# Patient Record
Sex: Male | Born: 1935 | Race: Black or African American | Hispanic: No
Health system: Southern US, Community
[De-identification: ages and names within clinical notes are randomized; demographics above are authoritative.]

## PROBLEM LIST (undated history)

## (undated) DIAGNOSIS — F329 Major depressive disorder, single episode, unspecified: Secondary | ICD-10-CM

## (undated) DIAGNOSIS — I10 Essential (primary) hypertension: Secondary | ICD-10-CM

## (undated) DIAGNOSIS — F32A Depression, unspecified: Secondary | ICD-10-CM

## (undated) DIAGNOSIS — E785 Hyperlipidemia, unspecified: Secondary | ICD-10-CM

## (undated) HISTORY — PX: EYE SURGERY: SHX253

---

## 2014-11-14 ENCOUNTER — Inpatient Hospital Stay (HOSPITAL_COMMUNITY): Payer: Medicare Other

## 2014-11-14 DIAGNOSIS — R001 Bradycardia, unspecified: Secondary | ICD-10-CM | POA: Diagnosis present

## 2014-11-14 DIAGNOSIS — Z66 Do not resuscitate: Secondary | ICD-10-CM | POA: Diagnosis present

## 2014-11-14 DIAGNOSIS — J9601 Acute respiratory failure with hypoxia: Secondary | ICD-10-CM | POA: Diagnosis not present

## 2014-11-14 DIAGNOSIS — D696 Thrombocytopenia, unspecified: Secondary | ICD-10-CM | POA: Diagnosis present

## 2014-11-14 DIAGNOSIS — Z79899 Other long term (current) drug therapy: Secondary | ICD-10-CM

## 2014-11-14 DIAGNOSIS — D649 Anemia, unspecified: Secondary | ICD-10-CM | POA: Diagnosis present

## 2014-11-14 DIAGNOSIS — E43 Unspecified severe protein-calorie malnutrition: Secondary | ICD-10-CM | POA: Diagnosis present

## 2014-11-14 DIAGNOSIS — I480 Paroxysmal atrial fibrillation: Secondary | ICD-10-CM | POA: Diagnosis present

## 2014-11-14 DIAGNOSIS — I10 Essential (primary) hypertension: Secondary | ICD-10-CM | POA: Diagnosis present

## 2014-11-14 DIAGNOSIS — F329 Major depressive disorder, single episode, unspecified: Secondary | ICD-10-CM | POA: Diagnosis present

## 2014-11-14 DIAGNOSIS — R4020 Unspecified coma: Secondary | ICD-10-CM | POA: Diagnosis not present

## 2014-11-14 DIAGNOSIS — Y92009 Unspecified place in unspecified non-institutional (private) residence as the place of occurrence of the external cause: Secondary | ICD-10-CM | POA: Diagnosis not present

## 2014-11-14 DIAGNOSIS — I63419 Cerebral infarction due to embolism of unspecified middle cerebral artery: Secondary | ICD-10-CM | POA: Diagnosis not present

## 2014-11-14 DIAGNOSIS — S72001A Fracture of unspecified part of neck of right femur, initial encounter for closed fracture: Secondary | ICD-10-CM | POA: Diagnosis present

## 2014-11-14 DIAGNOSIS — F0390 Unspecified dementia without behavioral disturbance: Secondary | ICD-10-CM | POA: Diagnosis present

## 2014-11-14 DIAGNOSIS — M25551 Pain in right hip: Secondary | ICD-10-CM | POA: Diagnosis present

## 2014-11-14 DIAGNOSIS — G8929 Other chronic pain: Secondary | ICD-10-CM | POA: Diagnosis present

## 2014-11-14 DIAGNOSIS — E785 Hyperlipidemia, unspecified: Secondary | ICD-10-CM | POA: Diagnosis present

## 2014-11-14 DIAGNOSIS — D72829 Elevated white blood cell count, unspecified: Secondary | ICD-10-CM | POA: Diagnosis present

## 2014-11-14 DIAGNOSIS — G253 Myoclonus: Secondary | ICD-10-CM | POA: Diagnosis present

## 2014-11-14 DIAGNOSIS — N179 Acute kidney failure, unspecified: Secondary | ICD-10-CM | POA: Diagnosis not present

## 2014-11-14 DIAGNOSIS — R569 Unspecified convulsions: Secondary | ICD-10-CM | POA: Diagnosis present

## 2014-11-14 DIAGNOSIS — Z7982 Long term (current) use of aspirin: Secondary | ICD-10-CM | POA: Diagnosis not present

## 2014-11-14 DIAGNOSIS — Z515 Encounter for palliative care: Secondary | ICD-10-CM

## 2014-11-14 DIAGNOSIS — W010XXA Fall on same level from slipping, tripping and stumbling without subsequent striking against object, initial encounter: Secondary | ICD-10-CM | POA: Diagnosis present

## 2014-11-15 ENCOUNTER — Encounter (HOSPITAL_COMMUNITY): Payer: Self-pay | Admitting: Internal Medicine

## 2014-11-15 ENCOUNTER — Inpatient Hospital Stay (HOSPITAL_COMMUNITY): Payer: Medicare Other | Admitting: Certified Registered Nurse Anesthetist

## 2014-11-15 ENCOUNTER — Other Ambulatory Visit: Payer: Self-pay | Admitting: Orthopedic Surgery

## 2014-11-15 ENCOUNTER — Inpatient Hospital Stay (HOSPITAL_COMMUNITY)
Admission: EM | Admit: 2014-11-15 | Discharge: 2014-11-28 | DRG: 469 | Disposition: E | Payer: Medicare Other | Source: Other Acute Inpatient Hospital | Attending: Internal Medicine | Admitting: Internal Medicine

## 2014-11-15 ENCOUNTER — Inpatient Hospital Stay (HOSPITAL_COMMUNITY): Payer: Medicare Other

## 2014-11-15 ENCOUNTER — Encounter (HOSPITAL_COMMUNITY): Admission: EM | Disposition: E | Payer: Self-pay | Source: Other Acute Inpatient Hospital | Attending: Pulmonary Disease

## 2014-11-15 DIAGNOSIS — E785 Hyperlipidemia, unspecified: Secondary | ICD-10-CM | POA: Diagnosis present

## 2014-11-15 DIAGNOSIS — I10 Essential (primary) hypertension: Secondary | ICD-10-CM | POA: Diagnosis present

## 2014-11-15 DIAGNOSIS — N179 Acute kidney failure, unspecified: Secondary | ICD-10-CM | POA: Insufficient documentation

## 2014-11-15 DIAGNOSIS — R569 Unspecified convulsions: Secondary | ICD-10-CM | POA: Diagnosis not present

## 2014-11-15 DIAGNOSIS — D649 Anemia, unspecified: Secondary | ICD-10-CM | POA: Diagnosis present

## 2014-11-15 DIAGNOSIS — I6340 Cerebral infarction due to embolism of unspecified cerebral artery: Secondary | ICD-10-CM | POA: Diagnosis not present

## 2014-11-15 DIAGNOSIS — Z09 Encounter for follow-up examination after completed treatment for conditions other than malignant neoplasm: Secondary | ICD-10-CM

## 2014-11-15 DIAGNOSIS — S72009A Fracture of unspecified part of neck of unspecified femur, initial encounter for closed fracture: Secondary | ICD-10-CM | POA: Insufficient documentation

## 2014-11-15 DIAGNOSIS — Z978 Presence of other specified devices: Secondary | ICD-10-CM

## 2014-11-15 DIAGNOSIS — Z7982 Long term (current) use of aspirin: Secondary | ICD-10-CM | POA: Diagnosis not present

## 2014-11-15 DIAGNOSIS — G8929 Other chronic pain: Secondary | ICD-10-CM | POA: Diagnosis present

## 2014-11-15 DIAGNOSIS — D696 Thrombocytopenia, unspecified: Secondary | ICD-10-CM | POA: Diagnosis present

## 2014-11-15 DIAGNOSIS — D72829 Elevated white blood cell count, unspecified: Secondary | ICD-10-CM | POA: Diagnosis present

## 2014-11-15 DIAGNOSIS — S72001A Fracture of unspecified part of neck of right femur, initial encounter for closed fracture: Secondary | ICD-10-CM | POA: Diagnosis present

## 2014-11-15 DIAGNOSIS — I480 Paroxysmal atrial fibrillation: Secondary | ICD-10-CM | POA: Diagnosis present

## 2014-11-15 DIAGNOSIS — F329 Major depressive disorder, single episode, unspecified: Secondary | ICD-10-CM | POA: Diagnosis present

## 2014-11-15 DIAGNOSIS — M25551 Pain in right hip: Secondary | ICD-10-CM | POA: Diagnosis present

## 2014-11-15 DIAGNOSIS — I639 Cerebral infarction, unspecified: Secondary | ICD-10-CM

## 2014-11-15 DIAGNOSIS — G9340 Encephalopathy, unspecified: Secondary | ICD-10-CM

## 2014-11-15 DIAGNOSIS — J9601 Acute respiratory failure with hypoxia: Secondary | ICD-10-CM | POA: Diagnosis not present

## 2014-11-15 DIAGNOSIS — Y92009 Unspecified place in unspecified non-institutional (private) residence as the place of occurrence of the external cause: Secondary | ICD-10-CM | POA: Diagnosis not present

## 2014-11-15 DIAGNOSIS — E43 Unspecified severe protein-calorie malnutrition: Secondary | ICD-10-CM | POA: Diagnosis present

## 2014-11-15 DIAGNOSIS — Z66 Do not resuscitate: Secondary | ICD-10-CM | POA: Diagnosis present

## 2014-11-15 DIAGNOSIS — Z515 Encounter for palliative care: Secondary | ICD-10-CM | POA: Diagnosis not present

## 2014-11-15 DIAGNOSIS — R001 Bradycardia, unspecified: Secondary | ICD-10-CM | POA: Diagnosis present

## 2014-11-15 DIAGNOSIS — R4020 Unspecified coma: Secondary | ICD-10-CM | POA: Diagnosis not present

## 2014-11-15 DIAGNOSIS — Z9289 Personal history of other medical treatment: Secondary | ICD-10-CM

## 2014-11-15 DIAGNOSIS — W010XXA Fall on same level from slipping, tripping and stumbling without subsequent striking against object, initial encounter: Secondary | ICD-10-CM | POA: Diagnosis present

## 2014-11-15 DIAGNOSIS — W19XXXA Unspecified fall, initial encounter: Secondary | ICD-10-CM | POA: Diagnosis not present

## 2014-11-15 DIAGNOSIS — Z79899 Other long term (current) drug therapy: Secondary | ICD-10-CM | POA: Diagnosis not present

## 2014-11-15 DIAGNOSIS — F0390 Unspecified dementia without behavioral disturbance: Secondary | ICD-10-CM | POA: Diagnosis present

## 2014-11-15 DIAGNOSIS — G253 Myoclonus: Secondary | ICD-10-CM | POA: Diagnosis present

## 2014-11-15 DIAGNOSIS — I63419 Cerebral infarction due to embolism of unspecified middle cerebral artery: Secondary | ICD-10-CM | POA: Diagnosis not present

## 2014-11-15 HISTORY — DX: Major depressive disorder, single episode, unspecified: F32.9

## 2014-11-15 HISTORY — DX: Essential (primary) hypertension: I10

## 2014-11-15 HISTORY — PX: TOTAL HIP ARTHROPLASTY: SHX124

## 2014-11-15 HISTORY — DX: Hyperlipidemia, unspecified: E78.5

## 2014-11-15 HISTORY — DX: Depression, unspecified: F32.A

## 2014-11-15 LAB — ABO/RH: ABO/RH(D): A POS

## 2014-11-15 LAB — RETICULOCYTES
RBC.: 4.23 MIL/uL (ref 4.22–5.81)
RETIC COUNT ABSOLUTE: 29.6 10*3/uL (ref 19.0–186.0)
Retic Ct Pct: 0.7 % (ref 0.4–3.1)

## 2014-11-15 LAB — CBC WITH DIFFERENTIAL/PLATELET
Basophils Absolute: 0 10*3/uL (ref 0.0–0.1)
Basophils Relative: 0 % (ref 0–1)
Eosinophils Absolute: 0.1 10*3/uL (ref 0.0–0.7)
Eosinophils Relative: 1 % (ref 0–5)
HEMATOCRIT: 37.3 % — AB (ref 39.0–52.0)
Hemoglobin: 12.3 g/dL — ABNORMAL LOW (ref 13.0–17.0)
LYMPHS PCT: 16 % (ref 12–46)
Lymphs Abs: 1.1 10*3/uL (ref 0.7–4.0)
MCH: 29.1 pg (ref 26.0–34.0)
MCHC: 33 g/dL (ref 30.0–36.0)
MCV: 88.2 fL (ref 78.0–100.0)
Monocytes Absolute: 0.7 10*3/uL (ref 0.1–1.0)
Monocytes Relative: 10 % (ref 3–12)
NEUTROS ABS: 5 10*3/uL (ref 1.7–7.7)
NEUTROS PCT: 73 % (ref 43–77)
Platelets: 136 10*3/uL — ABNORMAL LOW (ref 150–400)
RBC: 4.23 MIL/uL (ref 4.22–5.81)
RDW: 14.5 % (ref 11.5–15.5)
WBC: 6.8 10*3/uL (ref 4.0–10.5)

## 2014-11-15 LAB — POCT I-STAT, CHEM 8
BUN: 28 mg/dL — AB (ref 6–20)
CALCIUM ION: 1.2 mmol/L (ref 1.13–1.30)
CHLORIDE: 103 mmol/L (ref 101–111)
Creatinine, Ser: 1.2 mg/dL (ref 0.61–1.24)
GLUCOSE: 86 mg/dL (ref 65–99)
HEMATOCRIT: 42 % (ref 39.0–52.0)
Hemoglobin: 14.3 g/dL (ref 13.0–17.0)
POTASSIUM: 5.8 mmol/L — AB (ref 3.5–5.1)
Sodium: 139 mmol/L (ref 135–145)
TCO2: 27 mmol/L (ref 0–100)

## 2014-11-15 LAB — SURGICAL PCR SCREEN
MRSA, PCR: NEGATIVE
Staphylococcus aureus: POSITIVE — AB

## 2014-11-15 LAB — IRON AND TIBC
Iron: 30 ug/dL — ABNORMAL LOW (ref 45–182)
Saturation Ratios: 12 % — ABNORMAL LOW (ref 17.9–39.5)
TIBC: 251 ug/dL (ref 250–450)
UIBC: 221 ug/dL

## 2014-11-15 LAB — TYPE AND SCREEN
ABO/RH(D): A POS
Antibody Screen: NEGATIVE

## 2014-11-15 LAB — PROTIME-INR
INR: 1.26 (ref 0.00–1.49)
Prothrombin Time: 15.9 seconds — ABNORMAL HIGH (ref 11.6–15.2)

## 2014-11-15 LAB — FERRITIN: Ferritin: 151 ng/mL (ref 24–336)

## 2014-11-15 LAB — FOLATE: Folate: 13.4 ng/mL (ref >5.9)

## 2014-11-15 LAB — APTT: aPTT: 32 seconds (ref 24–37)

## 2014-11-15 LAB — VITAMIN B12: Vitamin B-12: 158 pg/mL — ABNORMAL LOW (ref 180–914)

## 2014-11-15 SURGERY — ARTHROPLASTY, HIP, TOTAL, ANTERIOR APPROACH
Anesthesia: Monitor Anesthesia Care | Site: Hip | Laterality: Right

## 2014-11-15 MED ORDER — DEXAMETHASONE SODIUM PHOSPHATE 10 MG/ML IJ SOLN
INTRAMUSCULAR | Status: DC | PRN
  Administered 2014-11-15: 10 mg via INTRAVENOUS

## 2014-11-15 MED ORDER — BUPIVACAINE-EPINEPHRINE (PF) 0.5% -1:200000 IJ SOLN
INTRAMUSCULAR | Status: AC
  Filled 2014-11-15: qty 30

## 2014-11-15 MED ORDER — ACETAMINOPHEN 650 MG RE SUPP: 650.0000 mg | Freq: Four times a day (QID) | RECTAL | Status: DC | PRN

## 2014-11-15 MED ORDER — FENTANYL CITRATE (PF) 100 MCG/2ML IJ SOLN
INTRAMUSCULAR | Status: DC | PRN
  Administered 2014-11-15 (×5): 50 ug via INTRAVENOUS

## 2014-11-15 MED ORDER — ASPIRIN EC 325 MG PO TBEC: 325.0000 mg | DELAYED_RELEASE_TABLET | Freq: Two times a day (BID) | ORAL | Status: DC

## 2014-11-15 MED ORDER — HYDROCODONE-ACETAMINOPHEN 5-325 MG PO TABS: 1.0000 | ORAL_TABLET | Freq: Four times a day (QID) | ORAL | Status: DC | PRN

## 2014-11-15 MED ORDER — CEFAZOLIN SODIUM-DEXTROSE 2-3 GM-% IV SOLR
2.0000 g | INTRAVENOUS | Status: AC
  Administered 2014-11-15 – 2014-11-16 (×2): 2 g via INTRAVENOUS
  Filled 2014-11-15: qty 50

## 2014-11-15 MED ORDER — KETOROLAC TROMETHAMINE 30 MG/ML IJ SOLN
INTRAMUSCULAR | Status: AC
  Filled 2014-11-15: qty 1

## 2014-11-15 MED ORDER — HYDROGEN PEROXIDE 3 % EX SOLN
CUTANEOUS | Status: DC | PRN
  Administered 2014-11-15: 1

## 2014-11-15 MED ORDER — POVIDONE-IODINE 10 % EX SOLN
CUTANEOUS | Status: DC | PRN
  Administered 2014-11-15: 1 via TOPICAL

## 2014-11-15 MED ORDER — HYDRALAZINE HCL 20 MG/ML IJ SOLN: 2.0000 mg | INTRAMUSCULAR | Status: DC | PRN

## 2014-11-15 MED ORDER — ESCITALOPRAM OXALATE 10 MG PO TABS: 10.0000 mg | ORAL_TABLET | Freq: Every day | ORAL | Status: DC

## 2014-11-15 MED ORDER — TRANEXAMIC ACID 1000 MG/10ML IV SOLN
1000.0000 mg | INTRAVENOUS | Status: DC
  Filled 2014-11-15: qty 10

## 2014-11-15 MED ORDER — PANTOPRAZOLE SODIUM 40 MG IV SOLR
40.0000 mg | INTRAVENOUS | Status: DC
  Administered 2014-11-17 – 2014-11-20 (×3): 40 mg via INTRAVENOUS
  Filled 2014-11-15 (×8): qty 40

## 2014-11-15 MED ORDER — LACTATED RINGERS IV SOLN
INTRAVENOUS | Status: DC | PRN
  Administered 2014-11-15 (×2): via INTRAVENOUS

## 2014-11-15 MED ORDER — ACETAMINOPHEN 10 MG/ML IV SOLN
INTRAVENOUS | Status: AC
  Filled 2014-11-15: qty 100

## 2014-11-15 MED ORDER — MENTHOL 3 MG MT LOZG: 1.0000 | LOZENGE | OROMUCOSAL | Status: DC | PRN

## 2014-11-15 MED ORDER — CEFAZOLIN SODIUM-DEXTROSE 2-3 GM-% IV SOLR
2.0000 g | Freq: Four times a day (QID) | INTRAVENOUS | Status: AC
  Administered 2014-11-15 – 2014-11-16 (×2): 2 g via INTRAVENOUS
  Filled 2014-11-15 (×2): qty 50

## 2014-11-15 MED ORDER — PHENOL 1.4 % MT LIQD: 1.0000 | OROMUCOSAL | Status: DC | PRN

## 2014-11-15 MED ORDER — BUPIVACAINE-EPINEPHRINE (PF) 0.5% -1:200000 IJ SOLN
INTRAMUSCULAR | Status: DC | PRN
  Administered 2014-11-15: 30 mL

## 2014-11-15 MED ORDER — BUPIVACAINE IN DEXTROSE 0.75-8.25 % IT SOLN
INTRATHECAL | Status: DC | PRN
  Administered 2014-11-15: 2 mL via INTRATHECAL

## 2014-11-15 MED ORDER — ACETAMINOPHEN 10 MG/ML IV SOLN
1000.0000 mg | Freq: Once | INTRAVENOUS | Status: AC
  Administered 2014-11-15: 1000 mg via INTRAVENOUS

## 2014-11-15 MED ORDER — SODIUM CHLORIDE 0.9 % IV SOLN: INTRAVENOUS | Status: DC

## 2014-11-15 MED ORDER — SODIUM CHLORIDE 0.9 % IR SOLN
Status: DC | PRN
  Administered 2014-11-15: 1000 mL

## 2014-11-15 MED ORDER — PROPOFOL INFUSION 10 MG/ML OPTIME
INTRAVENOUS | Status: DC | PRN
  Administered 2014-11-15: 25 ug/kg/min via INTRAVENOUS

## 2014-11-15 MED ORDER — MORPHINE SULFATE 2 MG/ML IJ SOLN: 0.5000 mg | INTRAMUSCULAR | Status: DC | PRN

## 2014-11-15 MED ORDER — ONDANSETRON HCL 4 MG/2ML IJ SOLN
4.0000 mg | Freq: Four times a day (QID) | INTRAMUSCULAR | Status: DC | PRN
  Administered 2014-11-15: 4 mg via INTRAVENOUS

## 2014-11-15 MED ORDER — MEPERIDINE HCL 25 MG/ML IJ SOLN: 6.2500 mg | INTRAMUSCULAR | Status: DC | PRN

## 2014-11-15 MED ORDER — SODIUM CHLORIDE 0.9 % IV SOLN
INTRAVENOUS | Status: DC
  Administered 2014-11-15: 23:00:00 via INTRAVENOUS
  Administered 2014-11-15: 75 mL/h via INTRAVENOUS
  Administered 2014-11-16 – 2014-11-20 (×4): via INTRAVENOUS

## 2014-11-15 MED ORDER — ACETAMINOPHEN 325 MG PO TABS: 650.0000 mg | ORAL_TABLET | Freq: Four times a day (QID) | ORAL | Status: DC | PRN

## 2014-11-15 MED ORDER — CEFAZOLIN SODIUM-DEXTROSE 2-3 GM-% IV SOLR
2.0000 g | INTRAVENOUS | Status: DC
  Filled 2014-11-15 (×2): qty 50

## 2014-11-15 MED ORDER — ONDANSETRON HCL 4 MG/2ML IJ SOLN: 4.0000 mg | Freq: Four times a day (QID) | INTRAMUSCULAR | Status: DC | PRN

## 2014-11-15 MED ORDER — ONDANSETRON HCL 4 MG PO TABS: 4.0000 mg | ORAL_TABLET | Freq: Four times a day (QID) | ORAL | Status: DC | PRN

## 2014-11-15 MED ORDER — GLYCOPYRROLATE 0.2 MG/ML IJ SOLN
INTRAMUSCULAR | Status: AC
  Filled 2014-11-15: qty 1

## 2014-11-15 MED ORDER — METOCLOPRAMIDE HCL 5 MG PO TABS
5.0000 mg | ORAL_TABLET | Freq: Three times a day (TID) | ORAL | Status: DC | PRN
  Filled 2014-11-15: qty 2

## 2014-11-15 MED ORDER — SODIUM CHLORIDE 0.9 % IR SOLN
Status: DC | PRN
  Administered 2014-11-15: 3000 mL

## 2014-11-15 MED ORDER — SENNA 8.6 MG PO TABS
1.0000 | ORAL_TABLET | Freq: Two times a day (BID) | ORAL | Status: DC
  Administered 2014-11-19 – 2014-11-20 (×3): 8.6 mg via ORAL
  Filled 2014-11-15 (×9): qty 1

## 2014-11-15 MED ORDER — ACETAMINOPHEN 10 MG/ML IV SOLN: 1000.0000 mg | INTRAVENOUS | Status: DC

## 2014-11-15 MED ORDER — FENTANYL CITRATE (PF) 250 MCG/5ML IJ SOLN
INTRAMUSCULAR | Status: AC
  Filled 2014-11-15: qty 5

## 2014-11-15 MED ORDER — SODIUM CHLORIDE 0.9 % IV SOLN
1000.0000 mg | INTRAVENOUS | Status: DC | PRN
  Administered 2014-11-15: 1000 mg via INTRAVENOUS

## 2014-11-15 MED ORDER — KETOROLAC TROMETHAMINE 30 MG/ML IJ SOLN
INTRAMUSCULAR | Status: DC | PRN
  Administered 2014-11-15: 30 mg

## 2014-11-15 MED ORDER — DOCUSATE SODIUM 100 MG PO CAPS
100.0000 mg | ORAL_CAPSULE | Freq: Two times a day (BID) | ORAL | Status: DC
  Administered 2014-11-19 – 2014-11-20 (×2): 100 mg via ORAL
  Filled 2014-11-15 (×9): qty 1

## 2014-11-15 MED ORDER — GLYCOPYRROLATE 0.2 MG/ML IJ SOLN
INTRAMUSCULAR | Status: DC | PRN
  Administered 2014-11-15 (×3): 0.2 mg via INTRAVENOUS

## 2014-11-15 MED ORDER — HYDROMORPHONE HCL 1 MG/ML IJ SOLN
0.5000 mg | INTRAMUSCULAR | Status: DC | PRN
  Administered 2014-11-15: 1 mg via INTRAVENOUS
  Filled 2014-11-15: qty 1

## 2014-11-15 MED ORDER — HYDROMORPHONE HCL 1 MG/ML IJ SOLN
INTRAMUSCULAR | Status: AC
  Administered 2014-11-15: 1 mg
  Filled 2014-11-15: qty 1

## 2014-11-15 MED ORDER — SODIUM CHLORIDE 0.9 % IJ SOLN
3.0000 mL | Freq: Two times a day (BID) | INTRAMUSCULAR | Status: DC
  Administered 2014-11-15: 3 mL via INTRAVENOUS

## 2014-11-15 MED ORDER — 0.9 % SODIUM CHLORIDE (POUR BTL) OPTIME
TOPICAL | Status: DC | PRN
  Administered 2014-11-15 (×2): 1000 mL

## 2014-11-15 MED ORDER — SODIUM CHLORIDE 0.9 % IJ SOLN
INTRAMUSCULAR | Status: DC | PRN
  Administered 2014-11-15: 30 mL

## 2014-11-15 MED ORDER — FENTANYL CITRATE (PF) 100 MCG/2ML IJ SOLN: 25.0000 ug | INTRAMUSCULAR | Status: DC | PRN

## 2014-11-15 MED ORDER — CHLORHEXIDINE GLUCONATE 4 % EX LIQD
60.0000 mL | Freq: Once | CUTANEOUS | Status: AC
  Administered 2014-11-15: 4 via TOPICAL
  Filled 2014-11-15: qty 60

## 2014-11-15 MED ORDER — METOCLOPRAMIDE HCL 5 MG/ML IJ SOLN
5.0000 mg | Freq: Three times a day (TID) | INTRAMUSCULAR | Status: DC | PRN
  Filled 2014-11-15: qty 2

## 2014-11-15 SURGICAL SUPPLY — 55 items
ADH SKN CLS APL DERMABOND .7 (GAUZE/BANDAGES/DRESSINGS) ×1
BLADE SAW SGTL 18X1.27X75 (BLADE) ×2 IMPLANT
BLADE SAW SGTL 18X1.27X75MM (BLADE) ×1
BLADE SURG ROTATE 9660 (MISCELLANEOUS) IMPLANT
CABLE WITH CRIMP (Cable) ×4 IMPLANT
CHLORAPREP W/TINT 26ML (MISCELLANEOUS) ×3 IMPLANT
COVER SURGICAL LIGHT HANDLE (MISCELLANEOUS) ×3 IMPLANT
DERMABOND ADVANCED (GAUZE/BANDAGES/DRESSINGS) ×2
DERMABOND ADVANCED .7 DNX12 (GAUZE/BANDAGES/DRESSINGS) ×2 IMPLANT
DRAPE C-ARM 42X72 X-RAY (DRAPES) ×3 IMPLANT
DRAPE IMP U-DRAPE 54X76 (DRAPES) ×6 IMPLANT
DRAPE STERI IOBAN 125X83 (DRAPES) ×3 IMPLANT
DRAPE U-SHAPE 47X51 STRL (DRAPES) ×9 IMPLANT
DRSG AQUACEL AG ADV 3.5X10 (GAUZE/BANDAGES/DRESSINGS) ×1 IMPLANT
DRSG AQUACEL AG ADV 3.5X14 (GAUZE/BANDAGES/DRESSINGS) ×2 IMPLANT
ELECT BLADE 4.0 EZ CLEAN MEGAD (MISCELLANEOUS) ×3
ELECT REM PT RETURN 9FT ADLT (ELECTROSURGICAL) ×3
ELECTRODE BLDE 4.0 EZ CLN MEGD (MISCELLANEOUS) ×1 IMPLANT
ELECTRODE REM PT RTRN 9FT ADLT (ELECTROSURGICAL) ×1 IMPLANT
EVACUATOR 1/8 PVC DRAIN (DRAIN) IMPLANT
GLOVE BIO SURGEON STRL SZ8.5 (GLOVE) ×6 IMPLANT
GLOVE BIOGEL PI IND STRL 8.5 (GLOVE) ×1 IMPLANT
GLOVE BIOGEL PI INDICATOR 8.5 (GLOVE) ×2
GOWN STRL REUS W/ TWL LRG LVL3 (GOWN DISPOSABLE) ×2 IMPLANT
GOWN STRL REUS W/TWL 2XL LVL3 (GOWN DISPOSABLE) ×3 IMPLANT
GOWN STRL REUS W/TWL LRG LVL3 (GOWN DISPOSABLE) ×6
HANDPIECE INTERPULSE COAX TIP (DISPOSABLE) ×3
HOOD PEEL AWAY FACE SHEILD DIS (HOOD) ×6 IMPLANT
KIT BASIN OR (CUSTOM PROCEDURE TRAY) ×3 IMPLANT
KIT ROOM TURNOVER OR (KITS) ×3 IMPLANT
MANIFOLD NEPTUNE II (INSTRUMENTS) ×3 IMPLANT
MARKER SKIN DUAL TIP RULER LAB (MISCELLANEOUS) ×3 IMPLANT
NDL SPNL 18GX3.5 QUINCKE PK (NEEDLE) ×1 IMPLANT
NEEDLE SPNL 18GX3.5 QUINCKE PK (NEEDLE) ×3 IMPLANT
NS IRRIG 1000ML POUR BTL (IV SOLUTION) ×3 IMPLANT
PACK TOTAL JOINT (CUSTOM PROCEDURE TRAY) ×3 IMPLANT
PACK UNIVERSAL I (CUSTOM PROCEDURE TRAY) ×3 IMPLANT
PAD ARMBOARD 7.5X6 YLW CONV (MISCELLANEOUS) ×6 IMPLANT
SEALER BIPOLAR AQUA 6.0 (INSTRUMENTS) ×2 IMPLANT
SET HNDPC FAN SPRY TIP SCT (DISPOSABLE) ×1 IMPLANT
SLEEVE CABLE 2MM VT (Orthopedic Implant) IMPLANT
SUCTION FRAZIER TIP 10 FR DISP (SUCTIONS) ×3 IMPLANT
SUT ETHIBOND NAB CT1 #1 30IN (SUTURE) ×6 IMPLANT
SUT MNCRL AB 3-0 PS2 18 (SUTURE) ×3 IMPLANT
SUT MON AB 2-0 CT1 36 (SUTURE) ×3 IMPLANT
SUT VIC AB 1 CT1 27 (SUTURE) ×3
SUT VIC AB 1 CT1 27XBRD ANBCTR (SUTURE) ×1 IMPLANT
SUT VIC AB 2-0 CT1 27 (SUTURE) ×3
SUT VIC AB 2-0 CT1 TAPERPNT 27 (SUTURE) ×1 IMPLANT
SUT VLOC 180 0 24IN GS25 (SUTURE) ×3 IMPLANT
SYR 50ML LL SCALE MARK (SYRINGE) ×3 IMPLANT
TOWEL OR 17X24 6PK STRL BLUE (TOWEL DISPOSABLE) ×3 IMPLANT
TOWEL OR 17X26 10 PK STRL BLUE (TOWEL DISPOSABLE) ×3 IMPLANT
TRAY FOLEY CATH 16FR SILVER (SET/KITS/TRAYS/PACK) ×2 IMPLANT
WATER STERILE IRR 1000ML POUR (IV SOLUTION) ×7 IMPLANT

## 2014-11-15 NOTE — Progress Notes (Signed)
RN called Dietitian Linna Caprice, MD) requesting order set for patient admitted from [facility] [place], [place]). RN informed from Elizabeth Sauer (on call PA for [place],MD) to call [facility] for further orders/instructions, that patient is to be NPO after midnight, and that surgery will occur on 11/22/2014. [facility] (5N Floor Coverage) text paged. RN informed by [facility] floor coverage Dr. Julian Reil will round on patient the morning of 6/18 and complete order set at that time. RN contacted by [facility] Admissions that patient should have been seen by admissions upon arrival at 2215 on 11/14/2014. RN sent text page to [facility] Admissions to confirm patient has arrived to the unit. Nursing will continue to monitor.

## 2014-11-15 NOTE — Discharge Instructions (Signed)
°Dr. Kamarian Sahakian °Joint Replacement Specialist °[facility] °[address] °[place],  [address] °[phone] ° ° °TOTAL HIP REPLACEMENT POSTOPERATIVE DIRECTIONS ° ° ° °Hip Rehabilitation, Guidelines Following Surgery  ° °WEIGHT BEARING °Weight bearing as tolerated with assist device (walker, cane, etc) as directed, use it as long as suggested by your surgeon or therapist, typically at least 4-6 weeks. ° °The results of a hip operation are greatly improved after range of motion and muscle strengthening exercises. Follow all safety measures which are given to protect your hip. If any of these exercises cause increased pain or swelling in your joint, decrease the amount until you are comfortable again. Then slowly increase the exercises. Call your caregiver if you have problems or questions.  ° °HOME CARE INSTRUCTIONS  °Most of the following instructions are designed to prevent the dislocation of your new hip.  °Remove items at home which could result in a fall. This includes throw rugs or furniture in walking pathways.  °Continue medications as instructed at time of discharge. °· You may have some home medications which will be placed on hold until you complete the course of blood thinner medication. °· You may start showering once you are discharged home. Do not remove your dressing. °Do not put on socks or shoes without following the instructions of your caregivers.   °Sit on chairs with arms. Use the chair arms to help push yourself up when arising.  °Arrange for the use of a toilet seat elevator so you are not sitting low.  °· Walk with walker as instructed.  °You may resume a sexual relationship in one month or when given the OK by your caregiver.  °Use walker as long as suggested by your caregivers.  °You may put full weight on your legs and walk as much as is comfortable. °Avoid periods of inactivity such as sitting longer than an hour when not asleep. This helps prevent  blood clots.  °You may return to work once you are cleared by your surgeon.  °Do not drive a car for 6 weeks or until released by your surgeon.  °Do not drive while taking narcotics.  °Wear elastic stockings for two weeks following surgery during the day but you may remove then at night.  °Make sure you keep all of your appointments after your operation with all of your doctors and caregivers. You should call the office at the above phone number and make an appointment for approximately two weeks after the date of your surgery. °Please pick up a stool softener and laxative for home use as long as you are requiring pain medications. °· ICE to the affected hip every three hours for 30 minutes at a time and then as needed for pain and swelling. Continue to use ice on the hip for pain and swelling from surgery. You may notice swelling that will progress down to the foot and ankle.  This is normal after surgery.  Elevate the leg when you are not up walking on it.   °It is important for you to complete the blood thinner medication as prescribed by your doctor. °· Continue to use the breathing machine which will help keep your temperature down.  It is common for your temperature to cycle up and down following surgery, especially at night when you are not up moving around and exerting yourself.  The breathing machine keeps your lungs expanded and your temperature down. ° °RANGE OF MOTION AND STRENGTHENING EXERCISES  °These exercises are   designed to help you keep full movement of your hip joint. Follow your caregiver's or physical therapist's instructions. Perform all exercises about fifteen times, three times per day or as directed. Exercise both hips, even if you have had only one joint replacement. These exercises can be done on a training (exercise) mat, on the floor, on a table or on a bed. Use whatever works the best and is most comfortable for you. Use music or television while you are exercising so that the exercises  are a pleasant break in your day. This will make your life better with the exercises acting as a break in routine you can look forward to.  Lying on your back, slowly slide your foot toward your buttocks, raising your knee up off the floor. Then slowly slide your foot back down until your leg is straight again.  Lying on your back, tighten up the muscle in the front of your thigh (quadriceps muscles). You can do this by keeping your leg straight and trying to raise your heel off the floor. This helps strengthen the largest muscle supporting your knee.  Lying on your back, tighten up the muscles of your buttocks both with the legs straight and with the knee bent at a comfortable angle while keeping your heel on the floor.   SKILLED REHAB INSTRUCTIONS: If the patient is transferred to a skilled rehab facility following release from the hospital, a list of the current medications will be sent to the facility for the patient to continue.  When discharged from the skilled rehab facility, please have the facility set up the patient's Home Health Physical Therapy prior to being released. Also, the skilled facility will be responsible for providing the patient with their medications at time of release from the facility to include their pain medication and their blood thinner medication. If the patient is still at the rehab facility at time of the two week follow up appointment, the skilled rehab facility will also need to assist the patient in arranging follow up appointment in our office and any transportation needs.  MAKE SURE YOU:  Understand these instructions.  Will watch your condition.  Will get help right away if you are not doing well or get worse.  Pick up stool softner and laxative for home use following surgery while on pain medications. Do not remove your dressing. The dressing is waterproof--it is OK to take showers. Continue to use ice for pain and swelling after surgery. Do not use any  lotions or creams on the incision until instructed by your surgeon. Total Hip Protocol. NO ACTIVE ABDUCTION and NO STRAIGHT LEG RAISES FOR 6 WEEKS.

## 2014-11-15 NOTE — Progress Notes (Signed)
Almyra Deforest, [facility] RN hand delivered one vial of morphine to patients RN upon admission from [facility] [place], [place]). The contents of the morphine vial were scant. [facility] RN informed patients RN that there were 2 mg left in the vial. RN asked charge RN Jacqualyn Posey) to witness waste of 0.25 ccs of morphine.

## 2014-11-15 NOTE — Transfer of Care (Signed)
Immediate Anesthesia Transfer of Care Note  Patient: Bruce Schultz  Procedure(s) Performed: Procedure(s): TOTAL HIP ARTHROPLASTY ANTERIOR APPROACH (Right)  Patient Location: PACU  Anesthesia Type:Spinal  Level of Consciousness: awake  Airway & Oxygen Therapy: Patient Spontanous Breathing  Post-op Assessment: Report given to RN and Post -op Vital signs reviewed and stable  Post vital signs: Reviewed and stable  Last Vitals:  Filed Vitals:   2014/11/29 1450  BP: 143/58  Pulse: 45  Temp: 36.7 C  Resp: 16    Complications: No apparent anesthesia complications

## 2014-11-15 NOTE — Anesthesia Preprocedure Evaluation (Signed)
Anesthesia Evaluation  Patient identified by MRN, date of birth, ID band Patient awake    Reviewed: Allergy & Precautions, NPO status , Patient's Chart, lab work & pertinent test results  Airway Mallampati: I  TM Distance: >3 FB Neck ROM: Full    Dental  (+) Missing, Dental Advisory Given   Pulmonary  breath sounds clear to auscultation        Cardiovascular hypertension, Pt. on medications Rhythm:Regular Rate:Normal     Neuro/Psych    GI/Hepatic   Endo/Other    Renal/GU      Musculoskeletal   Abdominal   Peds  Hematology   Anesthesia Other Findings   Reproductive/Obstetrics                             Anesthesia Physical Anesthesia Plan  ASA: II  Anesthesia Plan: MAC and Spinal   Post-op Pain Management:    Induction: Intravenous  Airway Management Planned: Simple Face Mask  Additional Equipment:   Intra-op Plan:   Post-operative Plan:   Informed Consent: I have reviewed the patients History and Physical, chart, labs and discussed the procedure including the risks, benefits and alternatives for the proposed anesthesia with the patient or authorized representative who has indicated his/her understanding and acceptance.   Dental advisory given  Plan Discussed with: CRNA, Anesthesiologist and Surgeon  Anesthesia Plan Comments:         Anesthesia Quick Evaluation

## 2014-11-15 NOTE — Consult Note (Signed)
ORTHOPAEDIC CONSULTATION  REQUESTING PHYSICIAN: Edsel Petrin, DO  PCP:  Colon Branch, MD  Chief Complaint: right hip pain  HPI: Bruce Schultz is a 81 y.o. male who complains of  Right hip pain after he stumbled and fell yesterday. Transferred from [facility] ED. Has dementia and lives with wife. History is limited by MS - sounds like he is a minimal household ambulator. Denies other injuries.  Past Medical History  Diagnosis Date  . Hypertension   . Hyperlipidemia   . Depression    Past Surgical History  Procedure Laterality Date  . No past surgeries     History   Social History  . Marital Status: Unknown    Spouse Name: N/A  . Number of Children: N/A  . Years of Education: N/A   Social History Main Topics  . Smoking status: Not on file  . Smokeless tobacco: Not on file  . Alcohol Use: Not on file  . Drug Use: Not on file  . Sexual Activity: Not on file   Other Topics Concern  . Not on file   Social History Narrative  . No narrative on file   Family History  Problem Relation Age of Onset  . Family history unknown: Yes   Allergies not on file Prior to Admission medications   Not on File   Dg Hip Unilat With Pelvis 1v Right  11/19/2014   CLINICAL DATA:  Right hip pain after a fall today.  EXAM: RIGHT HIP (WITH PELVIS) 1 VIEW  COMPARISON:  None.  FINDINGS: Transverse fracture through the right femoral neck with varus angulation of the fracture fragments. No dislocation of the hip joint. Degenerative changes in the lower lumbar spine and both hips. Pelvis appears intact. SI joints and symphysis pubis are not displaced.  IMPRESSION: Acute transverse fracture of the right hip with varus angulation.   Electronically Signed   By: Burman Nieves M.D.   On: 11/10/2014 00:04   Dg Femur, Min 2 Views Right  11/18/2014   CLINICAL DATA:  81 year old male who fell today with right hip pain. Initial encounter.  EXAM: RIGHT FEMUR 2 VIEWS  COMPARISON:  [facility] right hip series 1815 hr 11/14/2014.  FINDINGS: Comminuted right femoral neck fracture with varus impaction re- demonstrated. Configuration is stable since 1815 hrs.  The more distal right femur appears intact. Severe tricompartmental degenerative changes at the right knee. Calcified atherosclerosis in the right lower extremity.  IMPRESSION: Mildly comminuted right femoral neck fracture with varus impaction is stable from earlier today.  No other right femur fracture identified.   Electronically Signed   By: Odessa Fleming M.D.   On: 11/01/2014 00:07    Positive ROS: All other systems have been reviewed and were otherwise negative with the exception of those mentioned in the HPI and as above.  Physical Exam: General: Alert, no acute distress Cardiovascular: No pedal edema Respiratory: No cyanosis, no use of accessory musculature GI: No organomegaly, abdomen is soft and non-tender Skin: No lesions in the area of chief complaint Neurologic: Sensation intact distally Psychiatric: Patient is competent for consent with normal mood and affect Lymphatic: No axillary or cervical lymphadenopathy  MUSCULOSKELETAL: RLE is shortened and externally rotated. Pain with logroll of hip. 2+ DP. +TA/GS/EHL. SILT.  Assessment: Displaced right femoral neck fracture  Plan: To OR hopefully today depending on OR schedule for RIGHT hip hemiarthroplasty vs THA NPO Hold chemical DVT ppx    Garnet Koyanagi, MD Cell [phone]  2014-11-19 8:04 AM

## 2014-11-15 NOTE — Brief Op Note (Signed)
11/14/2014 - 11/25/2014  10:20 PM  PATIENT:  Bruce Schultz  81 y.o. male  PRE-OPERATIVE DIAGNOSIS:  right femoral neck fracture  POST-OPERATIVE DIAGNOSIS:  right femoral neck fracture  PROCEDURE:  Procedure(s): TOTAL HIP ARTHROPLASTY ANTERIOR APPROACH (Right)  SURGEON:  Surgeon(s) and Role:    * Samson Frederic, MD - Primary  PHYSICIAN ASSISTANT:   ASSISTANTS: Alphonsa Overall, PA   ANESTHESIA:   spinal  EBL:  Total I/O In: 1300 [I.V.:1300] Out: 575 [Urine:125; Blood:450]  BLOOD ADMINISTERED:none  DRAINS: none   LOCAL MEDICATIONS USED:  MARCAINE     SPECIMEN:  No Specimen  DISPOSITION OF SPECIMEN:  N/A  COUNTS:  YES  TOURNIQUET:  * No tourniquets in log *  DICTATION: .Other Dictation: Dictation Number [id number]  PLAN OF CARE: Admit to inpatient   PATIENT DISPOSITION:  PACU - hemodynamically stable.   Delay start of Pharmacological VTE agent (>24hrs) due to surgical blood loss or risk of bleeding: no

## 2014-11-15 NOTE — Progress Notes (Signed)
   [facility]                                                                              Patient Demographics  Bruce Schultz, is a 81 y.o. male, DOB - February 05, 1926, IOX:[id number]  Admit date - 11/14/2014   Admitting Physician Hillary Bow, DO  Outpatient Primary MD for the patient is Colon Branch, MD  LOS - 1   No chief complaint on file.     HPI on Nov 24, 2014 by Dr. Della Goo Bruce Schultz is a 81 y.o. male with a history of HTN who was taken to the [facility] ED after falling at home. He reports that he has trouble walking due to chronic knee pain and he tripped and fell onto his right side. He was evaluated at the [facility] ED and X-rays revealed a Right Femoral Neck Fracture and Orthopedics Dr. Victorino Dike was consulted and arrangements were made for transfer to [facility] for admission for surgical intervention.  Assessment & Plan   Patient admitted earlier this morning by Dr. Della Goo.  Agree with current assessment and plan.  Right femoral neck fracture secondary to fall -Hip xray: Acute transverse fracture of the right hip with severe angulation -Orthopedic surgery consulted and appreciated -Possible surgery today -Continue pain control  Hypertension -lisinopril held -Continue hydralazine PRN  Normocytic Anemia -Anemia panel pending -Hb currently 12.3 -Continue to monitor CBC  Bradycardia -Continue telemetry monitoring -Will obtain TSH  Code Status: Full  Family Communication: None at bedside  Disposition Plan: Admitted, pending surgery  Time Spent in minutes   30 minutes  Procedures  None  Consults   Orthopedic surgery  DVT Prophylaxis  SCDs  Bruce Schultz D.O. on 2014/11/24 at 1:14 PM  Between 7am to 7pm - Pager - [phone]  After 7pm go to [email or web address] - password [email or web address]  And look for the night coverage person covering for me after hours  [facility] Office  [phone]

## 2014-11-15 NOTE — H&P (Signed)
[facility] Admission History and Physical       Bruce Schultz QMV:[id number] DOB: [birth year] DOA: 11/14/2014  Referring physician: Transfer from [place] ED in [place] Bruce Schultz PCP: Colon Branch, MD  Specialists:   Chief Complaint: Right Hip Pain after Falling  HPI: Bruce Schultz is a 81 y.o. male with a history of HTN who was taken to the [facility] ED after falling at home.  He reports that he has trouble walking due to chronic knee pain and he tripped and fell onto his right side.   He was evaluated at the [facility] ED and X-rays revealed a Right Femoral Neck Fracture and Orthopedics Dr. Victorino Dike was consulted and arrangements were made for transfer to [facility] for admission for surgical intervention.     Review of Systems:  Constitutional: No Weight Loss, No Weight Gain, Night Sweats, Fevers, Chills, Dizziness, Light Headedness, Fatigue, or Generalized Weakness HEENT: No Headaches, Difficulty Swallowing,Tooth/Dental Problems,Sore Throat,  No Sneezing, Rhinitis, Ear Ache, Nasal Congestion, or Post Nasal Drip,  Cardio-vascular:  No Chest pain, Orthopnea, PND, Edema in Lower Extremities, Anasarca, Dizziness, Palpitations  Resp: No Dyspnea, No DOE, No Productive Cough, No Non-Productive Cough, No Hemoptysis, No Wheezing.    GI: No Heartburn, Indigestion, Abdominal Pain, Nausea, Vomiting, Diarrhea, Constipation, Hematemesis, Hematochezia, Melena, Change in Bowel Habits,  Loss of Appetite  GU: No Dysuria, No Change in Color of Urine, No Urgency or Urinary Frequency, No Flank pain.  Musculoskeletal: +Right Hip Pain and Right Knee pain,  +Decreased Range of Motion of Right Hip due to pain, No Back Pain.  Neurologic: No Syncope, No Seizures, Muscle Weakness, Paresthesia, Vision Disturbance or Loss, No Diplopia, No Vertigo, No Difficulty Walking,  Skin: No Rash or Lesions. Psych: No Change in Mood or Affect, No Depression or Anxiety, No Memory loss, No Confusion, or  Hallucinations   Past Medical History  Diagnosis Date  . Hypertension   . Hyperlipidemia   . Depression      Past Surgical History  Procedure Laterality Date  . No past surgeries        Prior to Admission medications   Lisinopril Lexapro Aspirin      Allergies:  NKDA   Social History:  has no tobacco, alcohol, and drug history on file.      Family History  Problem Relation Age of Onset  . Family history unknown: Yes       Physical Exam:  GEN:  Pleasant Elderly Thin  81 y.o. African American male examined and in no acute distress; cooperative with exam Filed Vitals:   11/14/14 2237  BP: 156/54  Pulse: 56  Temp: 98.5 F (36.9 C)  TempSrc: Oral  Resp: 17  SpO2: 99%   Blood pressure 156/54, pulse 56, temperature 98.5 F (36.9 C), temperature source Oral, resp. rate 17, SpO2 99 %. PSYCH: He is alert and oriented x4; does not appear anxious does not appear depressed; affect is normal HEENT: Normocephalic and Atraumatic, Mucous membranes pink; PERRLA; EOM intact; Fundi:  Benign;  No scleral icterus, Nares: Patent, Oropharynx: Clear, Sparse Dentitition,    Neck:  FROM, No Cervical Lymphadenopathy nor Thyromegaly or Carotid Bruit; No JVD; Breasts:: Not examined CHEST WALL: No tenderness CHEST: Normal respiration, clear to auscultation bilaterally HEART: Regular rate and rhythm; no murmurs rubs or gallops BACK: No kyphosis or scoliosis; No CVA tenderness ABDOMEN: Positive Bowel Sounds, Scaphoid, Soft Non-Tender, No Rebound or Guarding; No Masses, No Organomegaly, Rectal Exam: Not done EXTREMITIES: No Cyanosis, Clubbing,  or Edema; No Ulcerations. Genitalia: not examined PULSES: 2+ and symmetric SKIN: Normal hydration no rash or ulceration CNS:  Alert and Oriented x 4, No Focal Deficits Vascular: pulses palpable throughout    Labs Prior to Transfer Done at [facility]: Reviewed    Radiological Exams on Admission: Dg Hip Unilat With Pelvis 1v  Right  13-Dec-2014   CLINICAL DATA:  Right hip pain after a fall today.  EXAM: RIGHT HIP (WITH PELVIS) 1 VIEW  COMPARISON:  None.  FINDINGS: Transverse fracture through the right femoral neck with varus angulation of the fracture fragments. No dislocation of the hip joint. Degenerative changes in the lower lumbar spine and both hips. Pelvis appears intact. SI joints and symphysis pubis are not displaced.  IMPRESSION: Acute transverse fracture of the right hip with varus angulation.   Electronically Signed   By: Burman Nieves M.D.   On: 12-13-2014 00:04   Dg Femur, Min 2 Views Right  Dec 13, 2014   CLINICAL DATA:  81 year old male who fell today with right hip pain. Initial encounter.  EXAM: RIGHT FEMUR 2 VIEWS  COMPARISON:  [facility] right hip series 1815 hr 11/14/2014.  FINDINGS: Comminuted right femoral neck fracture with varus impaction re- demonstrated. Configuration is stable since 1815 hrs.  The more distal right femur appears intact. Severe tricompartmental degenerative changes at the right knee. Calcified atherosclerosis in the right lower extremity.  IMPRESSION: Mildly comminuted right femoral neck fracture with varus impaction is stable from earlier today.  No other right femur fracture identified.   Electronically Signed   By: Odessa Fleming M.D.   On: 12/13/2014 00:07     EKG: Independently reviewed. Sinus Bradycardia rate 57   Assessment/Plan:   81 y.o. male with  Principal Problem:   1.   Closed right hip fracture   Orthopedic Dr. Victorino Dike   NPO   Pain Control with IV Dilaudid PRN   Active Problems:   2.   Hypertension   PRN IV Hydralazine while NPO   Resume Lisinopril RX when taking PO post operatively   Monitor BPs and BUN/Cr     3.   Fall  Fall Precautions     4.   Anemia   Send Anemia Panel  This AM   Send FOBT daily x 3     5.   Bradycardia, sinus   Telemetry monitoring     6.   DVT Prophylaxis    SCDs          Code Status:     FULL CODE     Family Communication: No Family Present    Disposition Plan:    Inpatient Status        Time spent:  4  Minutes      [name] [facility] Pager [phone]   If 7AM -7PM Please Contact the Day Rounding Team MD for [facility]  If 7PM-7AM, Please Contact Night-Floor Coverage  [email or web address] Password [email or web address] Dec 13, 2014, 3:12 AM     ADDENDUM:   Patient was seen and examined on 12/13/2014

## 2014-11-15 NOTE — Anesthesia Postprocedure Evaluation (Signed)
  Anesthesia Post-op Note  Patient: Bruce Schultz  Procedure(s) Performed: Procedure(s): TOTAL HIP ARTHROPLASTY ANTERIOR APPROACH (Right)  Patient Location: PACU  Anesthesia Type: MAC, Spinal   Level of Consciousness: awake, alert  and oriented  Airway and Oxygen Therapy: Patient Spontanous Breathing  Post-op Pain: none  Post-op Assessment: Post-op Vital signs reviewed; moving both feet but in no pain  Post-op Vital Signs: Reviewed  Last Vitals:  Filed Vitals:   11/26/2014 2227  BP: 129/90  Pulse: 68  Temp: 36.6 C  Resp:     Complications: No apparent anesthesia complications

## 2014-11-15 NOTE — Op Note (Signed)
NAME:  Bruce Schultz, CHILDREY NO.:  [id number]  MEDICAL RECORD NO.:  [id number]  LOCATION:  [facility]                         FACILITY:  [facility]  PHYSICIAN:  Samson Frederic, MD     DATE OF BIRTH:  28-Feb-1926  DATE OF PROCEDURE:  11/14/2014 DATE OF DISCHARGE:                              OPERATIVE REPORT   SURGEON:  Samson Frederic, MD.  ASSISTANT:  Thea Gist. PA-C  PREOPERATIVE DIAGNOSIS:  Comminuted displaced right femoral neck fracture.  POSTOPERATIVE DIAGNOSIS:  Comminuted displaced right femoral neck fracture.  PROCEDURE PERFORMED:  Right hip hemiarthroplasty, anterior approach.  IMPLANTS: 1. DePuy AML femoral stem, size 15 mm large stature x 160 mm in     length. 2. A 52 mm fracture hip head ball with -3 mm spacer. 3. Synthes 1.7 mm adult reconstruction cable x2.  ANESTHESIA:  Spinal.  EBL:  350 mL.  ANTIBIOTICS:  2 g Ancef.  DRAINS:  None.  COMPLICATIONS:  Nondisplaced calcar fracture.  DISPOSITION:  Stable to PACU.  INDICATIONS:  The patient is an 81 year old male who tripped and fell yesterday. He has a history of dementia.  He had hip pain and inability to weight bear.  X-rays revealed a displaced comminuted right femoral neck fracture.  Risks, benefits, and alternatives were explained to the family and they elected to proceed.  DESCRIPTION OF PROCEDURE IN DETAIL:  The surgical site was marked by myself.  The patient was taken to the operating room and spinal anesthesia was induced.  Foley catheter was inserted.  The patient was positioned on the Hana table.  All bony prominences were well padded. The hip was prepped and draped in normal sterile surgical fashion.  Time- out was called verifying site and site of surgery.  The patient received IV antibiotics within 60 minutes of beginning the procedure.  Direct anterior approach to the hip was performed through the Hueter interval.  The lateral circumflex vessels were treated with the  Aqua Mantis and then transected.  The anterior capsule was exposed and an inverted T-shaped capsulotomy was made.  Upon entering the joint, he did have a hematoma which I evacuated.  He had a comminuted mid cervical femoral neck fracture.  I freshened the neck cut with a saw.  I placed a corkscrew into the head and it was removed.  The head was passed to the back table and measured.  Acetabular exposure was achieved.  The labrum and cartilage were intact. I sized the fracture hip head ball to be 52 mm with excellent fit.  I gained femoral exposure taking care to protect the abductors and greater trochanter.  This was performed using external rotation, extension, and abduction of the leg.  The capsule was peeled off the inner aspect of the trochanter taking care to preserve the short external rotators.  I used a cookie cutter to enter the femoral canal followed by canal finer.  I broached up to a size 5 using a Tri Lock stem.  Due to his poor bone quality, he was unable to achieve good rotational stability.  Therefore, I decided to prepare for an AML stem. I reamed up to a size 15 mm stem and then  I broached up to a size 15 large body.  The real implant was placed and had an excellent fit.  I inspected the proximal femur, and he was found to have a nondisplaced calcar fracture that went down about 2 cm.  I placed a prophylactic cable distal to the lesser trochanter and this was tightened and crimped.  I then placed a second cable around the proximal femur just above the lesser trochanter.  I took care to be deep to the iliopsoas tendon.  I then trialed the head ball and leg lengths were checked fluoroscopically.  I then placed the real hip fracture head ball.  Final x-rays revealed no fractures that were visible.  I lengthened him a few millimeters and this was deemed adequate.  The wound was copiously irrigated with a dilute Betadine solution followed by normal saline.  Marcaine  solution was injected in the periarticular soft tissue.  Capsule was closed with #1 Vicryl suture and then the fascia was closed with a V lock.  Deep dermal layer was closed with 2-0 Monocryl followed by a running 3-0 Monocryl subcuticular stitch.  Dermabond was applied.  Once the glue was fully hardened, Aquacel Ag dressing was applied.  The patient was transported to the recovery room in stable condition.  Sponge, needle, and instrument counts were correct at the end of the case x2.  The patient tolerated procedure well.  Please note that a surgical assistant was medically necessary to perform this procedure in a safe and expeditious manner.  Assistant was necessary to provide appropriate retraction of vital neurovascular structures and to allow for anatomic placement of the prosthetic implants.  I discussed the operative events and findings with the patient's family. The plan will be to readmit him to the hospitalist service.  He may weight bear as tolerated with a walker.  We will keep him on a walker for 6 weeks.  He will be given aspirin 325 mg p.o. twice daily for DVT prophylaxis.  He will work with physical and occupational therapy.  I will plan to see him in the office in 2 weeks.  All questions were solicited and answered to his satisfaction.          ______________________________ Samson Frederic, MD     BS/MEDQ  D:  11/19/2014  T:  11/21/2014  Job:  [id number]

## 2014-11-15 NOTE — Anesthesia Procedure Notes (Addendum)
Procedure Name: MAC Date/Time: 11/14/2014 5:33 PM Performed by: Roney Mans P Pre-anesthesia Checklist: Patient identified, Emergency Drugs available, Suction available, Patient being monitored and Timeout performed Patient Re-evaluated:Patient Re-evaluated prior to inductionOxygen Delivery Method: Simple face mask Intubation Type: IV induction   Spinal Patient location during procedure: OR Start time: 11/10/2014 5:30 PM End time: 11/25/2014 5:35 PM Staffing Anesthesiologist: Nonna Renninger Performed by: anesthesiologist  Preanesthetic Checklist Completed: patient identified, site marked, surgical consent, pre-op evaluation, timeout performed, IV checked, risks and benefits discussed and monitors and equipment checked Spinal Block Patient position: sitting Prep: DuraPrep Patient monitoring: cardiac monitor, continuous pulse ox and blood pressure Approach: midline Location: L2-3 Injection technique: single-shot Needle Needle type: Quincke  Needle gauge: 22 G Needle length: 5 cm Needle insertion depth: 4 cm Assessment Sensory level: T6 Additional Notes +CSF was obtained without blood in 4 quadrants.  Spinal Marcaine, 7.5% in 8.25% Dextrose, 2 ml, was given.  Pt tolerated well.  Adequate level at T6. No paresthesias noted.

## 2014-11-16 ENCOUNTER — Inpatient Hospital Stay (HOSPITAL_COMMUNITY): Payer: Medicare Other

## 2014-11-16 DIAGNOSIS — W19XXXA Unspecified fall, initial encounter: Secondary | ICD-10-CM

## 2014-11-16 DIAGNOSIS — R4182 Altered mental status, unspecified: Secondary | ICD-10-CM

## 2014-11-16 DIAGNOSIS — D649 Anemia, unspecified: Secondary | ICD-10-CM

## 2014-11-16 LAB — COMPREHENSIVE METABOLIC PANEL
ALT: 18 U/L (ref 17–63)
ANION GAP: 19 — AB (ref 5–15)
AST: 41 U/L (ref 15–41)
Albumin: 2.5 g/dL — ABNORMAL LOW (ref 3.5–5.0)
Alkaline Phosphatase: 36 U/L — ABNORMAL LOW (ref 38–126)
BUN: 24 mg/dL — ABNORMAL HIGH (ref 6–20)
CO2: 11 mmol/L — ABNORMAL LOW (ref 22–32)
CREATININE: 1.65 mg/dL — AB (ref 0.61–1.24)
Calcium: 8.8 mg/dL — ABNORMAL LOW (ref 8.9–10.3)
Chloride: 109 mmol/L (ref 101–111)
GFR calc non Af Amer: 36 mL/min — ABNORMAL LOW (ref >60)
GFR, EST AFRICAN AMERICAN: 41 mL/min — AB (ref >60)
Glucose, Bld: 208 mg/dL — ABNORMAL HIGH (ref 65–99)
Potassium: 4.3 mmol/L (ref 3.5–5.1)
Sodium: 139 mmol/L (ref 135–145)
Total Bilirubin: 1 mg/dL (ref 0.3–1.2)
Total Protein: 5.5 g/dL — ABNORMAL LOW (ref 6.5–8.1)

## 2014-11-16 LAB — URINALYSIS, ROUTINE W REFLEX MICROSCOPIC
BILIRUBIN URINE: NEGATIVE
GLUCOSE, UA: NEGATIVE mg/dL
Ketones, ur: 15 mg/dL — AB
Nitrite: NEGATIVE
Protein, ur: 100 mg/dL — AB
SPECIFIC GRAVITY, URINE: 1.037 — AB (ref 1.005–1.030)
Urobilinogen, UA: 0.2 mg/dL (ref 0.0–1.0)
pH: 5 (ref 5.0–8.0)

## 2014-11-16 LAB — CBC
HCT: 38.5 % — ABNORMAL LOW (ref 39.0–52.0)
Hemoglobin: 12.8 g/dL — ABNORMAL LOW (ref 13.0–17.0)
MCH: 29.6 pg (ref 26.0–34.0)
MCHC: 33.2 g/dL (ref 30.0–36.0)
MCV: 89.1 fL (ref 78.0–100.0)
Platelets: 128 10*3/uL — ABNORMAL LOW (ref 150–400)
RBC: 4.32 MIL/uL (ref 4.22–5.81)
RDW: 14.3 % (ref 11.5–15.5)
WBC: 12 10*3/uL — AB (ref 4.0–10.5)

## 2014-11-16 LAB — URINE MICROSCOPIC-ADD ON

## 2014-11-16 LAB — MAGNESIUM: MAGNESIUM: 1.9 mg/dL (ref 1.7–2.4)

## 2014-11-16 MED ORDER — SODIUM CHLORIDE 0.9 % IV SOLN
1000.0000 mg | INTRAVENOUS | Status: AC
  Administered 2014-11-16: 1000 mg via INTRAVENOUS
  Filled 2014-11-16: qty 10

## 2014-11-16 MED ORDER — LORAZEPAM 2 MG/ML IJ SOLN
1.0000 mg | Freq: Once | INTRAMUSCULAR | Status: AC
  Administered 2014-11-16: 1 mg via INTRAVENOUS

## 2014-11-16 MED ORDER — LORAZEPAM 2 MG/ML IJ SOLN
INTRAMUSCULAR | Status: AC
  Filled 2014-11-16: qty 1

## 2014-11-16 MED ORDER — DEXTROSE 5 % IV SOLN
500.0000 mg | Freq: Two times a day (BID) | INTRAVENOUS | Status: DC
  Administered 2014-11-17 – 2014-11-22 (×10): 500 mg via INTRAVENOUS
  Filled 2014-11-16 (×14): qty 5

## 2014-11-16 MED ORDER — VALPROATE SODIUM 500 MG/5ML IV SOLN
1000.0000 mg | Freq: Once | INTRAVENOUS | Status: AC
  Administered 2014-11-16: 1000 mg via INTRAVENOUS
  Filled 2014-11-16: qty 10

## 2014-11-16 MED ORDER — SODIUM CHLORIDE 0.9 % IV SOLN
500.0000 mg | Freq: Two times a day (BID) | INTRAVENOUS | Status: DC
  Administered 2014-11-16 – 2014-11-23 (×13): 500 mg via INTRAVENOUS
  Filled 2014-11-16 (×19): qty 5

## 2014-11-16 MED ORDER — SODIUM CHLORIDE 0.9 % IV SOLN
1000.0000 mg | Freq: Once | INTRAVENOUS | Status: AC
  Administered 2014-11-16: 1000 mg via INTRAVENOUS
  Filled 2014-11-16: qty 10

## 2014-11-16 NOTE — Progress Notes (Signed)
Subjective: 1 Day Post-Op Procedure(s) (LRB): TOTAL HIP ARTHROPLASTY ANTERIOR APPROACH (Right) Pt unresponsive early this AM 0430, Neuro working up for seizures vs. Stroke with EEG, MRI brain pending. Pt unable to communicate this AM. Neuro in room.  Objective: Vital signs in last 24 hours: Temp:  [97.2 F (36.2 C)-98.1 F (36.7 C)] 97.9 F (36.6 C) (06/19 0440) Pulse Rate:  [45-95] 89 (06/19 0500) Resp:  [11-16] 13 (06/18 2217) BP: (95-143)/(52-90) 100/69 mmHg (06/19 0500) SpO2:  [94 %-100 %] 94 % (06/19 0500)  Intake/Output from previous day: 06/18 0701 - 06/19 0700 In: 1300 [I.V.:1300] Out: 925 [Urine:475; Blood:450] Intake/Output this shift:     Recent Labs  2014/12/06 0501 12/06/2014 1634 11/16/14 0520  HGB 12.3* 14.3 12.8*    Recent Labs  2014-12-06 0501 12/06/14 1634 11/16/14 0520  WBC 6.8  --  12.0*  RBC 4.23  4.23  --  4.32  HCT 37.3* 42.0 38.5*  PLT 136*  --  128*    Recent Labs  12/06/14 1634 11/16/14 0520  NA 139 139  K 5.8* 4.3  CL 103 109  CO2  --  11*  BUN 28* 24*  CREATININE 1.20 1.65*  GLUCOSE 86 208*  CALCIUM  --  8.8*    Recent Labs  06-Dec-2014 0501  INR 1.26    Neurovascular intact Intact pulses distally Incision: dressing C/D/I and no drainage No cellulitis present Compartment soft no sign of DVT  Assessment/Plan: 1 Day Post-Op Procedure(s) (LRB): TOTAL HIP ARTHROPLASTY ANTERIOR APPROACH (Right) DVT ppx Seizure workup per Neuro When medically stable start PT, may WBAT RLE Discussed with Dr. Dawna Part, Dayna Barker. 11/16/2014, 9:11 AM

## 2014-11-16 NOTE — Progress Notes (Signed)
LTM day 1 started. Texted Dr Jobe Gibbon.

## 2014-11-16 NOTE — Progress Notes (Addendum)
[facility]                                                                              Patient Demographics  Bruce Schultz, is a 81 y.o. male, DOB - December 02, 1925, IZT:[id number]  Admit date - 11/14/2014   Admitting Physician Hillary Bow, DO  Outpatient Primary MD for the patient is Colon Branch, MD  LOS - 2   No chief complaint on file.     HPI on 11/04/2014 by Dr. Della Goo [name] is a 81 y.o. male with a history of HTN who was taken to the [facility] ED after falling at home. He reports that he has trouble walking due to chronic knee pain and he tripped and fell onto his right side. He was evaluated at the [facility] ED and X-rays revealed a Right Femoral Neck Fracture and Orthopedics Dr. Victorino Dike was consulted and arrangements were made for transfer to [facility] for admission for surgical intervention.  Assessment & Plan   Acute encephalopathy -Overnight patient became unresponsive with left lateral gaze -DDx stroke vs seizure? -CT head: no acute intracranial abnormalities  -Neurology consulted and appreciated -EEG currently being done -Pending MRI -Patient was given keppra  Right femoral neck fracture secondary to fall -Hip xray: Acute transverse fracture of the right hip with severe angulation -Orthopedic surgery consulted and appreciated -S/p total hip arthroplasty, anterior approach -Continue pain control -patient will need PT and OT consult was AMS resolved  ?Acute kidney injury vs CKD Stage 3 -Baseline Cr/GFR unknown -Cr currently 1.6 -Will give IVF -Will continue to monitor BMP  Hypertension -lisinopril held -Continue hydralazine PRN  Normocytic Anemia -Anemia panel: iron 30, Ferritin 151 -Hb currently 12.8 -Continue to monitor CBC  Bradycardia -Continue telemetry monitoring -TSH pending  Leukocytosis -Likely reactive to ?seizure vs surgery -Will continue to monitor CBC, if worsens, will  panculture.  Code Status: Full  Family Communication: None at bedside  Disposition Plan: Admitted, pending further evaluation of AMS  Time Spent in minutes 30 minutes  Procedures  Right Total hip arthroplasty, anterior approach  Consults  Orthopedic surgery Neurology  DVT Prophylaxis SCDs  Lab Results  Component Value Date   PLT 128* 11/16/2014    Medications  Scheduled Meds: . aspirin EC  325 mg Oral BID PC  . docusate sodium  100 mg Oral BID  . escitalopram  10 mg Oral Daily  . levETIRAcetam  500 mg Intravenous Q12H  . pantoprazole (PROTONIX) IV  40 mg Intravenous Q24H  . senna  1 tablet Oral BID   Continuous Infusions: . sodium chloride 75 mL/hr at 11/27/2014 2238   PRN Meds:.acetaminophen **OR** acetaminophen, hydrALAZINE, HYDROcodone-acetaminophen, menthol-cetylpyridinium **OR** phenol, metoCLOPramide **OR** metoCLOPramide (REGLAN) injection, morphine injection, ondansetron **OR** ondansetron (ZOFRAN) IV  Antibiotics    Anti-infectives    Start     Dose/Rate Route Frequency Ordered Stop   11/16/14 0600  ceFAZolin (ANCEF) IVPB 2 g/50 mL premix     2 g 100 mL/hr over 30 Minutes Intravenous On call to O.R. 11/04/2014 1327 11/16/14 0715   11/16/14 0000  ceFAZolin (ANCEF) IVPB 2 g/50 mL premix     2 g 100 mL/hr over  30 Minutes Intravenous Every 6 hours 11/18/2014 2232 11/16/14 0718   11/20/2014 1345  ceFAZolin (ANCEF) IVPB 2 g/50 mL premix  Status:  Discontinued     2 g 100 mL/hr over 30 Minutes Intravenous To Surgery 11/09/2014 0804 11/09/2014 2232     Subjective:   [name] seen and examined today. Patient currently nonverbal.  Does respond to some stimulation.  Continues to have lateral gaze.   Objective:   Filed Vitals:   11/16/14 0023 11/16/14 0415 11/16/14 0440 11/16/14 0500  BP: 131/70 101/74 95/82 100/69  Pulse: 72 92 95 89  Temp: 98 F (36.7 C)  97.9 F (36.6 C)   TempSrc: Axillary  Oral   Resp:      SpO2: 96% 96% 96% 94%    Wt Readings  from Last 3 Encounters:  No data found for Wt     Intake/Output Summary (Last 24 hours) at 11/16/14 1058 Last data filed at 11/16/14 0636  Gross per 24 hour  Intake   1300 ml  Output    825 ml  Net    475 ml    Exam  General: Well developed, well nourished, NAD, appears stated age  HEENT: NCAT,  mucous membranes moist. Left lateral gaze  Cardiovascular: S1 S2 auscultated, no rubs, murmurs or gallops. Regular rate and rhythm.  Respiratory: Clear to auscultation bilaterally with equal chest rise  Abdomen: Soft, nontender, nondistended, + bowel sounds  Extremities: warm dry without cyanosis clubbing or edema  Neuro: Unable to assess, left lateral gaze, responds to tactile stimulation   Data Review   Micro Results Recent Results (from the past 240 hour(s))  Surgical pcr screen     Status: Abnormal   Collection Time: 11/08/2014  1:32 PM  Result Value Ref Range Status   MRSA, PCR NEGATIVE NEGATIVE Final   Staphylococcus aureus POSITIVE (A) NEGATIVE Final    Comment:        The Xpert SA Assay (FDA approved for NASAL specimens in patients over 60 years of age), is one component of a comprehensive surveillance program.  Test performance has been validated by [facility] for patients greater than or equal to 26 year old. It is not intended to diagnose infection nor to guide or monitor treatment.     Radiology Reports Ct Head Wo Contrast  11/16/2014   CLINICAL DATA:  Seizures.  EXAM: CT HEAD WITHOUT CONTRAST  TECHNIQUE: Contiguous axial images were obtained from the base of the skull through the vertex without intravenous contrast.  COMPARISON:  11/14/2014  FINDINGS: Examination is technically limited due to motion artifact. Mild diffuse cerebral atrophy. Low-attenuation changes diffusely throughout the white matter consistent with small vessel ischemia. 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 No mass effect or midline shift.  No abnormal extra-axial fluid collections. Gray-white matter junctions are distinct. Basal cisterns are not effaced. No evidence of acute intracranial hemorrhage. No depressed skull fractures. Visualized paranasal sinuses and mastoid air cells are not opacified.  IMPRESSION: No acute intracranial abnormalities. Chronic atrophy and small vessel ischemic changes. 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 No change since prior study.   Electronically Signed   By: Burman Nieves M.D.   On: 11/16/2014 06:00   Pelvis Portable  11/22/2014   CLINICAL DATA:  81 year old male status post right hip surgery for proximal femur fracture. Initial encounter.  EXAM: PORTABLE PELVIS 1-2 VIEWS  COMPARISON:  11/14/2014.  FINDINGS: Portable supine AP views  of the pelvis 2311 hrs. Right proximal femoral arthroplasty now in place with cerclage wires. Hardware appears intact. Alignment with the acetabulum appears normal on this view. Surrounding postoperative changes to the soft tissues with subcutaneous gas. No unexpected osseous changes or new osseous abnormality identified.  IMPRESSION: Right hip arthroplasty with no adverse features.   Electronically Signed   By: Odessa Fleming M.D.   On: 12/09/2014 23:35   Dg Hip Unilat With Pelvis 1v Right  2014/12/09   CLINICAL DATA:  Right hip pain after a fall today.  EXAM: RIGHT HIP (WITH PELVIS) 1 VIEW  COMPARISON:  None.  FINDINGS: Transverse fracture through the right femoral neck with varus angulation of the fracture fragments. No dislocation of the hip joint. Degenerative changes in the lower lumbar spine and both hips. Pelvis appears intact. SI joints and symphysis pubis are not displaced.  IMPRESSION: Acute transverse fracture of the right hip with varus angulation.   Electronically Signed   By: Burman Nieves M.D.   On: December 09, 2014 00:04   Dg Hip Operative Unilat With Pelvis Right  12-09-14   CLINICAL DATA:  Right hip replacement.  EXAM:  OPERATIVE RIGHT HIP (WITH PELVIS IF PERFORMED) 3 VIEWS  TECHNIQUE: Fluoroscopic spot image(s) were submitted for interpretation post-operatively.  FLUOROSCOPY TIME:  Radiation Exposure Index (as provided by the fluoroscopic device): Not applicable  If the device does not provide the exposure index:  Fluoroscopy Time:  0 min 41 seconds  Number of Acquired Images:  3  COMPARISON:  Yesterday.  FINDINGS: Interval right hip prosthesis in satisfactory position and alignment on frontal views. No fracture or dislocation seen on these views.  IMPRESSION: Satisfactory postoperative appearance of a right hip prosthesis in the frontal projection.   Electronically Signed   By: Beckie Salts M.D.   On: Dec 09, 2014 21:41   Dg Femur, Min 2 Views Right  12-09-2014   CLINICAL DATA:  81 year old male who fell today with right hip pain. Initial encounter.  EXAM: RIGHT FEMUR 2 VIEWS  COMPARISON:  [facility] right hip series 1815 hr 11/14/2014.  FINDINGS: Comminuted right femoral neck fracture with varus impaction re- demonstrated. Configuration is stable since 1815 hrs.  The more distal right femur appears intact. Severe tricompartmental degenerative changes at the right knee. Calcified atherosclerosis in the right lower extremity.  IMPRESSION: Mildly comminuted right femoral neck fracture with varus impaction is stable from earlier today.  No other right femur fracture identified.   Electronically Signed   By: Odessa Fleming M.D.   On: 2014-12-09 00:07    CBC  Recent Labs Lab 2014/12/09 0501 Dec 09, 2014 1634 11/16/14 0520  WBC 6.8  --  12.0*  HGB 12.3* 14.3 12.8*  HCT 37.3* 42.0 38.5*  PLT 136*  --  128*  MCV 88.2  --  89.1  MCH 29.1  --  29.6  MCHC 33.0  --  33.2  RDW 14.5  --  14.3  LYMPHSABS 1.1  --   --   MONOABS 0.7  --   --   EOSABS 0.1  --   --   BASOSABS 0.0  --   --     Chemistries   Recent Labs Lab 12-09-14 1634 11/16/14 0520  NA 139 139  K 5.8* 4.3  CL 103 109  CO2  --  11*  GLUCOSE 86  208*  BUN 28* 24*  CREATININE 1.20 1.65*  CALCIUM  --  8.8*  MG  --  1.9  AST  --  41  ALT  --  18  ALKPHOS  --  36*  BILITOT  --  1.0   ------------------------------------------------------------------------------------------------------------------ CrCl cannot be calculated (Unknown ideal weight.). ------------------------------------------------------------------------------------------------------------------ No results for input(s): HGBA1C in the last 72 hours. ------------------------------------------------------------------------------------------------------------------ No results for input(s): CHOL, HDL, LDLCALC, TRIG, CHOLHDL, LDLDIRECT in the last 72 hours. ------------------------------------------------------------------------------------------------------------------ No results for input(s): TSH, T4TOTAL, T3FREE, THYROIDAB in the last 72 hours.  Invalid input(s): FREET3 ------------------------------------------------------------------------------------------------------------------  Recent Labs  12-10-14 0501  VITAMINB12 158*  FOLATE 13.4  FERRITIN 151  TIBC 251  IRON 30*  RETICCTPCT 0.7    Coagulation profile  Recent Labs Lab 2014/12/10 0501  INR 1.26    No results for input(s): DDIMER in the last 72 hours.  Cardiac Enzymes No results for input(s): CKMB, TROPONINI, MYOGLOBIN in the last 168 hours.  Invalid input(s): CK ------------------------------------------------------------------------------------------------------------------ Invalid input(s): POCBNP    Bing Duffey D.O. on 11/16/2014 at 10:58 AM  Between 7am to 7pm - Pager - [phone]  After 7pm go to [email or web address] - password [email or web address]  And look for the night coverage person covering for me after hours  [facility] Office  [phone]

## 2014-11-16 NOTE — Progress Notes (Addendum)
Central telemetry called this RN around 04:15 am and informed that pt is having SVT with a.fib. RN entered the room and found pt with eyes closed, unresponsive. Rapid response was called and arrived in the unit. Pt started to have Lt gaze deviation. Vital signs taken as follows BP 101/74, HR 92 and O2 96% on room air. Rapid response nurse called [facility] and ordered CT-scan of the head. Will continue to monitor.

## 2014-11-16 NOTE — Progress Notes (Addendum)
Neurologist Dr Hosie Poisson arrived in the unit @ 5am. Upon assessment, pt started to have facial twitching and his head turned to the left. MD ordered Keppra 1000mg  Q12, MRI w/o contrast, u/a and EEG stat. Orders carried out. Pt put on seizure precaution. Oxygen and suction set up. Will continue to monitor.

## 2014-11-16 NOTE — Progress Notes (Signed)
STAT EEG completed; results pending. 

## 2014-11-16 NOTE — Consult Note (Signed)
Consult Reason for Consult: altered mental status Referring Physician: Dr Catha Gosselin  CC: altered mental status  HPI: Bruce Schultz is an 81 y.o. male hx of HTN, HLD, depression admitted on 6/18 after suffering a fall at home with resultant R femoral neck fracture. Had successful surgical repair on 6/18 with no complications. Post procedure was doing well. LSW 0130 on 6/19. Around 0430 noted to be unresponsive with left gaze deviation, would withdrawal symmetrically to noxious stimuli. Shortly after RN noted facial twitching which progressed to involve his head turned to the left with right arm extended and repetitive flexion of LUE. Episode lasted around 30 seconds to 1 minute. Eyes midline after episode but he remains unresponsive. He received ativan  x 1 and loaded with keppra  x 1.   CT head imaging reviewed   Past Medical History  Diagnosis Date  . Hypertension   . Hyperlipidemia   . Depression     Past Surgical History  Procedure Laterality Date  . No past surgeries      Family History  Problem Relation Age of Onset  . Family history unknown: Yes    Social History:  has no tobacco, alcohol, and drug history on file.  Not on File  Medications:  Scheduled: . LORazepam      . aspirin EC  325 mg Oral BID PC  .  ceFAZolin (ANCEF) IV  2 g Intravenous Q6H  . docusate sodium  100 mg Oral BID  . escitalopram  10 mg Oral Daily  . levETIRAcetam  1,000 mg Intravenous STAT  . pantoprazole (PROTONIX) IV  40 mg Intravenous Q24H  . senna  1 tablet Oral BID    ROS: Out of a complete 14 system review, the patient complains of only the following symptoms, and all other reviewed systems are negative. Unable to assess  Physical Examination: Filed Vitals:   11/16/14 0500  BP: 100/69  Pulse: 89  Temp:   Resp:    Physical Exam  Constitutional: He appears well-developed and well-nourished.  Psych: Affect appropriate to situation Eyes: No scleral injection HENT: No OP  obstrucion Head: Normocephalic.  Cardiovascular: Normal rate and regular rhythm.  Respiratory: Effort normal and breath sounds normal.  GI: Soft. Bowel sounds are normal. No distension. There is no tenderness.  Skin: WDI  Neurologic Examination Mental Status: Eyes open, non-verbal, not following commands Cranial Nerves: II: optic discs not visualized, no blink to threat, pupils equal, round, reactive to light and accommodation III,IV, VI: ptosis not present, left gaze deviation that resolved to midline gaze V,VII: face symmetric, unable to test facial sensation VIII: unable to test IX,X: gag reflex present XI: unable to test XII: unable to test Motor: No spontaneous movement, no withdrawal to noxious stimuli Sensory: no withdrawal to noxious stimuli Deep Tendon Reflexes:1+ and symmetric Plantars: Right: downgoing   Left: downgoing Cerebellar: Unable to test Gait: unable to test  Laboratory Studies:   Basic Metabolic Panel:  Recent Labs Lab 12/05/2014 1634  NA 139  K 5.8*  CL 103  GLUCOSE 86  BUN 28*  CREATININE 1.20    Liver Function Tests: No results for input(s): AST, ALT, ALKPHOS, BILITOT, PROT, ALBUMIN in the last 168 hours. No results for input(s): LIPASE, AMYLASE in the last 168 hours. No results for input(s): AMMONIA in the last 168 hours.  CBC:  Recent Labs Lab 12/05/14 0501 12/05/2014 1634  WBC 6.8  --   NEUTROABS 5.0  --   HGB 12.3* 14.3  HCT  37.3* 42.0  MCV 88.2  --   PLT 136*  --     Cardiac Enzymes: No results for input(s): CKTOTAL, CKMB, CKMBINDEX, TROPONINI in the last 168 hours.  BNP: Invalid input(s): POCBNP  CBG: No results for input(s): GLUCAP in the last 168 hours.  Microbiology: Results for orders placed or performed during the hospital encounter of 11/14/14  Surgical pcr screen     Status: Abnormal   Collection Time: 11/22/2014  1:32 PM  Result Value Ref Range Status   MRSA, PCR NEGATIVE NEGATIVE Final   Staphylococcus  aureus POSITIVE (A) NEGATIVE Final    Comment:        The Xpert SA Assay (FDA approved for NASAL specimens in patients over 74 years of age), is one component of a comprehensive surveillance program.  Test performance has been validated by [facility] for patients greater than or equal to 39 year old. It is not intended to diagnose infection nor to guide or monitor treatment.     Coagulation Studies:  Recent Labs  11/20/2014 0501  LABPROT 15.9*  INR 1.26    Urinalysis: No results for input(s): COLORURINE, LABSPEC, PHURINE, GLUCOSEU, HGBUR, BILIRUBINUR, KETONESUR, PROTEINUR, UROBILINOGEN, NITRITE, LEUKOCYTESUR in the last 168 hours.  Invalid input(s): APPERANCEUR  Lipid Panel:  No results found for: CHOL, TRIG, HDL, CHOLHDL, VLDL, LDLCALC  HgbA1C: No results found for: HGBA1C  Urine Drug Screen:  No results found for: LABOPIA, COCAINSCRNUR, LABBENZ, AMPHETMU, THCU, LABBARB  Alcohol Level: No results for input(s): ETH in the last 168 hours.  Oth Imaging: Pelvis Portable  11/18/2014   CLINICAL DATA:  81 year old male status post right hip surgery for proximal femur fracture. Initial encounter.  EXAM: PORTABLE PELVIS 1-2 VIEWS  COMPARISON:  11/14/2014.  FINDINGS: Portable supine AP views of the pelvis 2311 hrs. Right proximal femoral arthroplasty now in place with cerclage wires. Hardware appears intact. Alignment with the acetabulum appears normal on this view. Surrounding postoperative changes to the soft tissues with subcutaneous gas. No unexpected osseous changes or new osseous abnormality identified.  IMPRESSION: Right hip arthroplasty with no adverse features.   Electronically Signed   By: Odessa Fleming M.D.   On: 11/03/2014 23:35   Dg Hip Unilat With Pelvis 1v Right  10/30/2014   CLINICAL DATA:  Right hip pain after a fall today.  EXAM: RIGHT HIP (WITH PELVIS) 1 VIEW  COMPARISON:  None.  FINDINGS: Transverse fracture through the right femoral neck with varus angulation of the  fracture fragments. No dislocation of the hip joint. Degenerative changes in the lower lumbar spine and both hips. Pelvis appears intact. SI joints and symphysis pubis are not displaced.  IMPRESSION: Acute transverse fracture of the right hip with varus angulation.   Electronically Signed   By: Burman Nieves M.D.   On: 10/30/2014 00:04   Dg Hip Operative Unilat With Pelvis Right  11/21/2014   CLINICAL DATA:  Right hip replacement.  EXAM: OPERATIVE RIGHT HIP (WITH PELVIS IF PERFORMED) 3 VIEWS  TECHNIQUE: Fluoroscopic spot image(s) were submitted for interpretation post-operatively.  FLUOROSCOPY TIME:  Radiation Exposure Index (as provided by the fluoroscopic device): Not applicable  If the device does not provide the exposure index:  Fluoroscopy Time:  0 min 41 seconds  Number of Acquired Images:  3  COMPARISON:  Yesterday.  FINDINGS: Interval right hip prosthesis in satisfactory position and alignment on frontal views. No fracture or dislocation seen on these views.  IMPRESSION: Satisfactory postoperative appearance of a right hip prosthesis  in the frontal projection.   Electronically Signed   By: Beckie Salts M.D.   On: 11/14/2014 21:41   Dg Femur, Min 2 Views Right  11/26/2014   CLINICAL DATA:  81 year old male who fell today with right hip pain. Initial encounter.  EXAM: RIGHT FEMUR 2 VIEWS  COMPARISON:  [facility] right hip series 1815 hr 11/14/2014.  FINDINGS: Comminuted right femoral neck fracture with varus impaction re- demonstrated. Configuration is stable since 1815 hrs.  The more distal right femur appears intact. Severe tricompartmental degenerative changes at the right knee. Calcified atherosclerosis in the right lower extremity.  IMPRESSION: Mildly comminuted right femoral neck fracture with varus impaction is stable from earlier today.  No other right femur fracture identified.   Electronically Signed   By: Odessa Fleming M.D.   On: 11/08/2014 00:07     Assessment/Plan:  81y/o  gentleman hx of HTN and HLD admitted with R femoral neck fracture s/p successful total hip arthroplasty on 6/18 now with acute onset of altered mental status and suspected new onset seizure. Unclear etiology. Seizure activity has stopped but patient remains unresponsive and not back at baseline. Has been given  ativan x 1 and loaded with keppra . Cannot rule out NCSE so will order stat EEG. Stroke triggering a seizure in the differential.   -stat EEG (tech notified) -MRI brain -check CMP, Magnesium, UA -keppra  BID  Elspeth Cho, DO [facility] [phone]  If 7pm- 7am, please page neurology on call as listed in AMION. 11/16/2014, 5:17 AM

## 2014-11-16 NOTE — Progress Notes (Signed)
Addendum: EEG performed and patient with sharp activity that was intermittent and generalized.  Unclear if this was artifact due to frequent head movement of epileptiform in nature.  Clinically patient was unresponsive with eye deviation to the left.  1mg  Ativan administered and activity improved to bursts of beta activity over the left hemisphere with intervening low voltage PMD.  Clinically patient improved as well.  With light stimulation patient opened eyes and attempted to look to the right. Mag normal at 1.9.  WBC count mildly elevated.  Afebrile.    Plan: 1.  Overnight EEG monitoring 2.  Ativan prn 3.  MRI on hold due to overnight monitoring 4.  Additional Keppra load of 1gm now  Thana Farr, MD [facility] [phone]

## 2014-11-16 NOTE — Progress Notes (Signed)
PT Cancellation Note  Patient Details Name: Bruce Schultz MRN: [id number] DOB: 07/11/1925   Cancelled Treatment:    Reason Eval/Treat Not Completed: Medical issues which prohibited therapy (Rapid response called last night.  Currently EEG monitoring).  Neuro team attempting to determine seizure activity vs. Stroke.  PT will continue to follow acutely.  Michail Jewels PT, DPT [phone] Pager: [phone] 11/16/2014, 9:54 AM

## 2014-11-16 NOTE — Significant Event (Addendum)
Rapid Response Event Note Called by primary RN to see  Pt with ALOC Overview: Time Called: 0415 Arrival Time: 0417 Event Type: Neurologic  Initial Focused Assessment: On arrival to room pt has his eyes open with a Lt gaze deviation, nonverbal, unable to f/c, withdraws to pain in all extremities. VSS. Contacted M. Lynch, NP orders rcvd for stat CT head.  Per RN LKW 0130  On the way to CT scan pt began having facial & upper body twitching, not rhythmic, and only occasional. By the end of CT scan pt was having frequent twitching similar to partial seizures.  Dr. Wyatt Portela was updated about progression of symptoms when he arrived to bedside.    Interventions: CT Head  Event Summary: Name of Physician Notified: M. Burnadette Peter, NP at [phone]    at          [facility], Bruce Schultz

## 2014-11-16 NOTE — Care Management Note (Signed)
Case Management Note  Patient Details  Name: DEUNTAY FRIESEN MRN: [id number] Date of Birth: [birth year]  Subjective/Objective:                  R femoral neck fracture s/p successful total hip arthroplasty on 6/18  Action/Plan: Discharge planning  Expected Discharge Date:                  Expected Discharge Plan:  Skilled Nursing Facility  In-House Referral:  Clinical Social Work  Discharge planning Services  CM Consult  Post Acute Care Choice:    Choice offered to:     DME Arranged:    DME Agency:     HH Arranged:    HH Agency:     Status of Service:  Completed, signed off  Medicare Important Message Given:    Date Medicare IM Given:    Medicare IM give by:    Date Additional Medicare IM Given:    Additional Medicare Important Message give by:     If discussed at Long Length of Stay Meetings, dates discussed:    Additional Comments: Discharge probable to SNF. SW consulted. PT/OT consults pending. No further CM needs identified at this time as SW will assess for d/c planning needs.  Darcel Smalling, RN 11/16/2014, 5:20 PM

## 2014-11-17 ENCOUNTER — Inpatient Hospital Stay (HOSPITAL_COMMUNITY): Payer: Medicare Other

## 2014-11-17 ENCOUNTER — Encounter (HOSPITAL_COMMUNITY): Payer: Self-pay | Admitting: Radiology

## 2014-11-17 DIAGNOSIS — S72001A Fracture of unspecified part of neck of right femur, initial encounter for closed fracture: Principal | ICD-10-CM

## 2014-11-17 DIAGNOSIS — R569 Unspecified convulsions: Secondary | ICD-10-CM | POA: Insufficient documentation

## 2014-11-17 LAB — CBC
HCT: 29 % — ABNORMAL LOW (ref 39.0–52.0)
Hemoglobin: 9.9 g/dL — ABNORMAL LOW (ref 13.0–17.0)
MCH: 30.9 pg (ref 26.0–34.0)
MCHC: 35.2 g/dL (ref 30.0–36.0)
MCV: 87.9 fL (ref 78.0–100.0)
Platelets: 101 10*3/uL — ABNORMAL LOW (ref 150–400)
RBC: 3.3 MIL/uL — AB (ref 4.22–5.81)
RDW: 14.9 % (ref 11.5–15.5)
WBC: 9.2 10*3/uL (ref 4.0–10.5)

## 2014-11-17 LAB — BASIC METABOLIC PANEL
ANION GAP: 5 (ref 5–15)
BUN: 41 mg/dL — ABNORMAL HIGH (ref 6–20)
CALCIUM: 7.6 mg/dL — AB (ref 8.9–10.3)
CO2: 23 mmol/L (ref 22–32)
Chloride: 107 mmol/L (ref 101–111)
Creatinine, Ser: 1.93 mg/dL — ABNORMAL HIGH (ref 0.61–1.24)
GFR calc Af Amer: 34 mL/min — ABNORMAL LOW (ref >60)
GFR calc non Af Amer: 30 mL/min — ABNORMAL LOW (ref >60)
GLUCOSE: 104 mg/dL — AB (ref 65–99)
POTASSIUM: 4.6 mmol/L (ref 3.5–5.1)
SODIUM: 135 mmol/L (ref 135–145)

## 2014-11-17 LAB — LIPID PANEL
CHOL/HDL RATIO: 3.9 ratio
CHOLESTEROL: 132 mg/dL (ref 0–200)
HDL: 34 mg/dL — ABNORMAL LOW (ref >40)
LDL Cholesterol: 80 mg/dL (ref 0–99)
Triglycerides: 92 mg/dL (ref <150)
VLDL: 18 mg/dL (ref 0–40)

## 2014-11-17 LAB — CREATININE, URINE, RANDOM: Creatinine, Urine: 111.13 mg/dL

## 2014-11-17 LAB — SODIUM, URINE, RANDOM: SODIUM UR: 60 mmol/L

## 2014-11-17 LAB — VALPROIC ACID LEVEL: Valproic Acid Lvl: 32 ug/mL — ABNORMAL LOW (ref 50.0–100.0)

## 2014-11-17 LAB — TSH: TSH: 1.241 u[IU]/mL (ref 0.350–4.500)

## 2014-11-17 MED ORDER — LORAZEPAM 2 MG/ML IJ SOLN
INTRAMUSCULAR | Status: AC
  Filled 2014-11-17: qty 1

## 2014-11-17 MED ORDER — LORAZEPAM 2 MG/ML IJ SOLN
INTRAMUSCULAR | Status: AC
  Administered 2014-11-17: 1 mg
  Filled 2014-11-17: qty 1

## 2014-11-17 MED ORDER — SODIUM CHLORIDE 0.9 % IV SOLN
200.0000 mg | Freq: Once | INTRAVENOUS | Status: AC
  Administered 2014-11-17: 200 mg via INTRAVENOUS
  Filled 2014-11-17: qty 20

## 2014-11-17 MED ORDER — VALPROATE SODIUM 500 MG/5ML IV SOLN
1.0000 g | Freq: Once | INTRAVENOUS | Status: AC
  Administered 2014-11-17: 1000 mg via INTRAVENOUS
  Filled 2014-11-17 (×2): qty 10

## 2014-11-17 MED ORDER — ASPIRIN 300 MG RE SUPP
300.0000 mg | Freq: Two times a day (BID) | RECTAL | Status: DC
  Administered 2014-11-17 – 2014-11-19 (×4): 300 mg via RECTAL
  Filled 2014-11-17 (×9): qty 1

## 2014-11-17 NOTE — Progress Notes (Signed)
NEURO HOSPITALIST PROGRESS NOTE   SUBJECTIVE:                                                                                                                        Remains unresponsive. Exhibits frequent myoclonic like movements lower extremities. On c-EEG monitoring. Keppra 500 mg BID, and Depacon 500 mg BID. VPA level 32. Cr MRI brain pending.   OBJECTIVE:                                                                                                                           Vital signs in last 24 hours: Temp:  [98.1 F (36.7 C)-98.3 F (36.8 C)] 98.1 F (36.7 C) (06/20 0516) Pulse Rate:  [70-85] 70 (06/20 0516) Resp:  [16-17] 17 (06/20 0516) BP: (112-133)/(55-85) 112/85 mmHg (06/20 0516) SpO2:  [100 %] 100 % (06/20 0516)  Intake/Output from previous day: 06/19 0701 - 06/20 0700 In: 851.5 [I.V.:741.5; IV Piggyback:110] Out: 425 [Urine:425] Intake/Output this shift:   Nutritional status: Diet clear liquid Room service appropriate?: Yes; Fluid consistency:: Thin  Past Medical History  Diagnosis Date  . Hypertension   . Hyperlipidemia   . Depression    Physical Exam  Constitutional: He appears well-developed and well-nourished.  Psych: Affect appropriate to situation Eyes: No scleral injection HENT: No OP obstrucion Head: Normocephalic.  Cardiovascular: Normal rate and regular rhythm.  Respiratory: Effort normal and breath sounds normal.  GI: Soft. Bowel sounds are normal. No distension. There is no tenderness.  Skin: WDI  Neurologic Exam:  Mental Status: Unresponsive. Cranial Nerves: II: optic discs not visualized, no blink to threat, pupils equal, round, reactive to light and accommodation III,IV, VI: ptosis not present, mild left gaze deviation that resolved to midline gaze V,VII: face symmetric, unable to test facial sensation VIII: unable to test IX,X: gag reflex present XI: unable to test XII: unable to  test Motor: No spontaneous movement but withdrawal to noxious stimuli Sensory: withdrawal of LE to noxious stimuli Deep Tendon Reflexes:1+ and symmetric Plantars: Right: downgoingLeft: downgoing Cerebellar: Unable to test Gait: unable to test  Lab Results: No results found for: CHOL Lipid Panel No results for input(s): CHOL, TRIG, HDL, CHOLHDL, VLDL, LDLCALC in the last 72 hours.  Studies/Results: Ct Head Wo Contrast  11/16/2014   CLINICAL DATA:  Seizures.  EXAM: CT HEAD WITHOUT CONTRAST  TECHNIQUE: Contiguous axial images were obtained from the base of the skull through the vertex without intravenous contrast.  COMPARISON:  11/14/2014  FINDINGS: Examination is technically limited due to motion artifact. Mild diffuse cerebral atrophy. Low-attenuation changes diffusely throughout the white matter consistent with small vessel ischemia. 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 No mass effect or midline shift. No abnormal extra-axial fluid collections. Gray-white matter junctions are distinct. Basal cisterns are not effaced. No evidence of acute intracranial hemorrhage. No depressed skull fractures. Visualized paranasal sinuses and mastoid air cells are not opacified.  IMPRESSION: No acute intracranial abnormalities. Chronic atrophy and small vessel ischemic changes. 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 No change since prior study.   Electronically Signed   By: Burman Nieves M.D.   On: 11/16/2014 06:00   Pelvis Portable  11/10/2014   CLINICAL DATA:  81 year old male status post right hip surgery for proximal femur fracture. Initial encounter.  EXAM: PORTABLE PELVIS 1-2 VIEWS  COMPARISON:  11/14/2014.  FINDINGS: Portable supine AP views of the pelvis 2311 hrs. Right proximal femoral arthroplasty now in place with cerclage wires. Hardware appears intact. Alignment with the  acetabulum appears normal on this view. Surrounding postoperative changes to the soft tissues with subcutaneous gas. No unexpected osseous changes or new osseous abnormality identified.  IMPRESSION: Right hip arthroplasty with no adverse features.   Electronically Signed   By: Odessa Fleming M.D.   On: 11/11/2014 23:35   Dg Hip Operative Unilat With Pelvis Right  11/03/2014   CLINICAL DATA:  Right hip replacement.  EXAM: OPERATIVE RIGHT HIP (WITH PELVIS IF PERFORMED) 3 VIEWS  TECHNIQUE: Fluoroscopic spot image(s) were submitted for interpretation post-operatively.  FLUOROSCOPY TIME:  Radiation Exposure Index (as provided by the fluoroscopic device): Not applicable  If the device does not provide the exposure index:  Fluoroscopy Time:  0 min 41 seconds  Number of Acquired Images:  3  COMPARISON:  Yesterday.  FINDINGS: Interval right hip prosthesis in satisfactory position and alignment on frontal views. No fracture or dislocation seen on these views.  IMPRESSION: Satisfactory postoperative appearance of a right hip prosthesis in the frontal projection.   Electronically Signed   By: Beckie Salts M.D.   On: 11/02/2014 21:41    MEDICATIONS                                                                                                                        Scheduled: . aspirin EC  325 mg Oral BID PC  . docusate sodium  100 mg Oral BID  . escitalopram  10 mg Oral Daily  . levETIRAcetam  500 mg Intravenous Q12H  . pantoprazole (PROTONIX) IV  40 mg Intravenous Q24H  . senna  1 tablet Oral BID  . valproate sodium  500 mg Intravenous Q12H  ASSESSMENT/PLAN:                                                                                                            81y/o gentleman hx of HTN and HLD admitted with R femoral neck fracture s/p successful total hip arthroplasty on 6/18 now with acute onset of altered mental status and suspected new onset seizure. Unclear etiology. Seizure activity has stopped but  patient remains unresponsive.   Concern for NCSE, formal c-EEG reading pending. MRI brain as soon as feasible (will need to disconnect LTM brfore going for MRI). Sub-therapeutic VPA level: will give additional 1 gram IV Depacon now and check VPA level. Will follow up.  Wyatt Portela, MD [facility] [phone]  11/17/2014, 8:37 AM

## 2014-11-17 NOTE — Progress Notes (Signed)
Patient need MRI but on continuous EEG. Per Dr. Leroy Kennedy, EEG can be paused and resume once MRI complete. MRI/EEG tech notified of changed.   Sim Boast, RN

## 2014-11-17 NOTE — Progress Notes (Signed)
LTM DAY 2- Pt's head checked; leads P3 and A1 reprepped. Pt moved successfully to 4N06.

## 2014-11-17 NOTE — Progress Notes (Signed)
Orthopedic Tech Progress Note Patient Details:  Bruce Schultz 1925/08/08 [id number]  Patient ID: Bruce Schultz, male   DOB: [birth year], 81 y.o.   MRN: [id number] Pt unable to use trapeze bar patient helper  Nikki Dom 11/17/2014, 7:28 AM

## 2014-11-17 NOTE — Progress Notes (Signed)
[facility]                                                                              Patient Demographics  Bruce Schultz, is a 81 y.o. male, DOB - 1925/08/29, ZOX:[id number]  Admit date - 11/14/2014   Admitting Physician Hillary Bow, DO  Outpatient Primary MD for the patient is Colon Branch, MD  LOS - 3   No chief complaint on file.     HPI on 10/30/2014 by Dr. Della Goo NICHOLS Schultz is a 80 y.o. male with a history of HTN who was taken to the [facility] ED after falling at home. He reports that he has trouble walking due to chronic knee pain and he tripped and fell onto his right side. He was evaluated at the [facility] ED and X-rays revealed a Right Femoral Neck Fracture and Orthopedics Dr. Victorino Dike was consulted and arrangements were made for transfer to [facility] for admission for surgical intervention.  Assessment & Plan   Acute encephalopathy/New Onset Seizure -Overnight patient became unresponsive with left lateral gaze -DDx stroke vs seizure? -CT head: no acute intracranial abnormalities  -Neurology consulted and appreciated -Continuous EEG currently being done -Pending MRI -Continue keppra and depacon  Right femoral neck fracture secondary to fall -Hip xray: Acute transverse fracture of the right hip with severe angulation -Orthopedic surgery consulted and appreciated -S/p total hip arthroplasty, anterior approach -Continue pain control -patient will need PT and OT consult was AMS resolved  ?Acute kidney injury vs CKD Stage 3 -Baseline Cr/GFR unknown -Cr currently 1.93 -Will give IVF -Will continue to monitor BMP -Will obtain renal US and urine electrolytes   Hypertension -lisinopril held -Continue hydralazine PRN  Normocytic Anemia -Anemia panel: iron 30, Ferritin 151 -Hb currently 9.9 -Suspect drop from dilutional component vs recent surgery -Will transfuse if Hb <7 -Continue to monitor  CBC  Bradycardia -Continue telemetry monitoring -TSH 1.241  Leukocytosis -Resolved -Likely reactive to ?seizure vs surgery  Code Status: Full  Family Communication: None at bedside  Disposition Plan: Admitted, pending further evaluation of AMS/seizures. MRI pending. Continuous EEG.  Continue to monitor closely.   Time Spent in minutes 30 minutes  Procedures  Right Total hip arthroplasty, anterior approach  Consults  Orthopedic surgery Neurology  DVT Prophylaxis SCDs  Lab Results  Component Value Date   PLT 101* 11/17/2014    Medications  Scheduled Meds: . aspirin  300 mg Rectal BID PC  . docusate sodium  100 mg Oral BID  . escitalopram  10 mg Oral Daily  . levETIRAcetam  500 mg Intravenous Q12H  . pantoprazole (PROTONIX) IV  40 mg Intravenous Q24H  . senna  1 tablet Oral BID  . valproate sodium  1 g Intravenous Once  . valproate sodium  500 mg Intravenous Q12H   Continuous Infusions: . sodium chloride 75 mL/hr at 11/16/14 1555   PRN Meds:.acetaminophen **OR** acetaminophen, hydrALAZINE, HYDROcodone-acetaminophen, menthol-cetylpyridinium **OR** phenol, metoCLOPramide **OR** metoCLOPramide (REGLAN) injection, morphine injection, ondansetron **OR** ondansetron (ZOFRAN) IV  Antibiotics    Anti-infectives    Start     Dose/Rate Route Frequency Ordered Stop   11/16/14 0600  ceFAZolin (ANCEF) IVPB 2 g/50  mL premix     2 g 100 mL/hr over 30 Minutes Intravenous On call to O.R. 11/03/2014 1327 11/16/14 0715   11/16/14 0000  ceFAZolin (ANCEF) IVPB 2 g/50 mL premix     2 g 100 mL/hr over 30 Minutes Intravenous Every 6 hours 11/09/2014 2232 11/16/14 0718   11/14/2014 1345  ceFAZolin (ANCEF) IVPB 2 g/50 mL premix  Status:  Discontinued     2 g 100 mL/hr over 30 Minutes Intravenous To Surgery 10/31/2014 0804 10/29/2014 2232     Subjective:   Bruce Schultz seen and examined today. Patient currently nonverbal and having continuous EEG.     Objective:   Filed Vitals:    11/16/14 2012 11/17/14 0019 11/17/14 0516 11/17/14 0941  BP: 127/62 133/55 112/85 143/53  Pulse: 75 85 70 67  Temp: 98.2 F (36.8 C) 98.3 F (36.8 C) 98.1 F (36.7 C) 98.9 F (37.2 C)  TempSrc: Oral Axillary Axillary Axillary  Resp: SpO2: 100% 100% 100% 100%    Wt Readings from Last 3 Encounters:  No data found for Wt     Intake/Output Summary (Last 24 hours) at 11/17/14 1005 Last data filed at 11/17/14 0517  Gross per 24 hour  Intake  741.5 ml  Output    325 ml  Net  416.5 ml    Exam  General: Well developed, well nourished, no distress  HEENT: NCAT,  mucous membranes moist  Cardiovascular: S1 S2 auscultated, RRR, no murmurs  Respiratory: Clear to auscultation   Abdomen: Soft, nontender, nondistended, + bowel sounds  Extremities: warm dry without cyanosis clubbing or edema  Neuro: Unable to assess, left lateral gaze, responds to tactile stimulation.  Has myoclonic movements in the lower extremities   Data Review   Micro Results Recent Results (from the past 240 hour(s))  Surgical pcr screen     Status: Abnormal   Collection Time: 11/01/2014  1:32 PM  Result Value Ref Range Status   MRSA, PCR NEGATIVE NEGATIVE Final   Staphylococcus aureus POSITIVE (A) NEGATIVE Final    Comment:        The Xpert SA Assay (FDA approved for NASAL specimens in patients over 29 years of age), is one component of a comprehensive surveillance program.  Test performance has been validated by [facility] for patients greater than or equal to 85 year old. It is not intended to diagnose infection nor to guide or monitor treatment.     Radiology Reports Ct Head Wo Contrast  11/16/2014   CLINICAL DATA:  Seizures.  EXAM: CT HEAD WITHOUT CONTRAST  TECHNIQUE: Contiguous axial images were obtained from the base of the skull through the vertex without intravenous contrast.  COMPARISON:  11/14/2014  FINDINGS: Examination is technically limited due to motion artifact.  Mild diffuse cerebral atrophy. Low-attenuation changes diffusely throughout the white matter consistent with small vessel ischemia. 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 No mass effect or midline shift. No abnormal extra-axial fluid collections. Gray-white matter junctions are distinct. Basal cisterns are not effaced. No evidence of acute intracranial hemorrhage. No depressed skull fractures. Visualized paranasal sinuses and mastoid air cells are not opacified.  IMPRESSION: No acute intracranial abnormalities. Chronic atrophy and small vessel ischemic changes. 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 No change since prior study.   Electronically Signed   By: Burman Nieves M.D.   On: 11/16/2014 06:00   Pelvis Portable  11/17/2014  CLINICAL DATA:  81 year old male status post right hip surgery for proximal femur fracture. Initial encounter.  EXAM: PORTABLE PELVIS 1-2 VIEWS  COMPARISON:  11/14/2014.  FINDINGS: Portable supine AP views of the pelvis 2311 hrs. Right proximal femoral arthroplasty now in place with cerclage wires. Hardware appears intact. Alignment with the acetabulum appears normal on this view. Surrounding postoperative changes to the soft tissues with subcutaneous gas. No unexpected osseous changes or new osseous abnormality identified.  IMPRESSION: Right hip arthroplasty with no adverse features.   Electronically Signed   By: Odessa Fleming M.D.   On: 11/11/2014 23:35   Dg Hip Unilat With Pelvis 1v Right  11/05/2014   CLINICAL DATA:  Right hip pain after a fall today.  EXAM: RIGHT HIP (WITH PELVIS) 1 VIEW  COMPARISON:  None.  FINDINGS: Transverse fracture through the right femoral neck with varus angulation of the fracture fragments. No dislocation of the hip joint. Degenerative changes in the lower lumbar spine and both hips. Pelvis appears intact. SI joints and symphysis pubis are not displaced.  IMPRESSION:  Acute transverse fracture of the right hip with varus angulation.   Electronically Signed   By: Burman Nieves M.D.   On: 11/14/2014 00:04   Dg Hip Operative Unilat With Pelvis Right  11/11/2014   CLINICAL DATA:  Right hip replacement.  EXAM: OPERATIVE RIGHT HIP (WITH PELVIS IF PERFORMED) 3 VIEWS  TECHNIQUE: Fluoroscopic spot image(s) were submitted for interpretation post-operatively.  FLUOROSCOPY TIME:  Radiation Exposure Index (as provided by the fluoroscopic device): Not applicable  If the device does not provide the exposure index:  Fluoroscopy Time:  0 min 41 seconds  Number of Acquired Images:  3  COMPARISON:  Yesterday.  FINDINGS: Interval right hip prosthesis in satisfactory position and alignment on frontal views. No fracture or dislocation seen on these views.  IMPRESSION: Satisfactory postoperative appearance of a right hip prosthesis in the frontal projection.   Electronically Signed   By: Beckie Salts M.D.   On: 10/30/2014 21:41   Dg Femur, Min 2 Views Right  11/21/2014   CLINICAL DATA:  81 year old male who fell today with right hip pain. Initial encounter.  EXAM: RIGHT FEMUR 2 VIEWS  COMPARISON:  [facility] right hip series 1815 hr 11/14/2014.  FINDINGS: Comminuted right femoral neck fracture with varus impaction re- demonstrated. Configuration is stable since 1815 hrs.  The more distal right femur appears intact. Severe tricompartmental degenerative changes at the right knee. Calcified atherosclerosis in the right lower extremity.  IMPRESSION: Mildly comminuted right femoral neck fracture with varus impaction is stable from earlier today.  No other right femur fracture identified.   Electronically Signed   By: Odessa Fleming M.D.   On: 11/06/2014 00:07    CBC  Recent Labs Lab 11/17/2014 0501 11/27/2014 1634 11/16/14 0520 11/17/14 0510  WBC 6.8  --  12.0* 9.2  HGB 12.3* 14.3 12.8* 9.9*  HCT 37.3* 42.0 38.5* 29.0*  PLT 136*  --  128* 101*  MCV 88.2  --  89.1 87.9  MCH 29.1   --  29.6 30.9  MCHC 33.0  --  33.2 35.2  RDW 14.5  --  14.3 14.9  LYMPHSABS 1.1  --   --   --   MONOABS 0.7  --   --   --   EOSABS 0.1  --   --   --   BASOSABS 0.0  --   --   --     Chemistries  Recent Labs Lab 2014/11/22 1634 11/16/14 0520 11/17/14 0510  NA 139 139 135  K 5.8* 4.3 4.6  CL 103 109 107  CO2  --  11* 23  GLUCOSE 86 208* 104*  BUN 28* 24* 41*  CREATININE 1.20 1.65* 1.93*  CALCIUM  --  8.8* 7.6*  MG  --  1.9  --   AST  --  41  --   ALT  --  18  --   ALKPHOS  --  36*  --   BILITOT  --  1.0  --    ------------------------------------------------------------------------------------------------------------------ CrCl cannot be calculated (Unknown ideal weight.). ------------------------------------------------------------------------------------------------------------------ No results for input(s): HGBA1C in the last 72 hours. ------------------------------------------------------------------------------------------------------------------ No results for input(s): CHOL, HDL, LDLCALC, TRIG, CHOLHDL, LDLDIRECT in the last 72 hours. ------------------------------------------------------------------------------------------------------------------  Recent Labs  11/17/14 0510  TSH 1.241   ------------------------------------------------------------------------------------------------------------------  Recent Labs  11/22/2014 0501  VITAMINB12 158*  FOLATE 13.4  FERRITIN 151  TIBC 251  IRON 30*  RETICCTPCT 0.7    Coagulation profile  Recent Labs Lab 2014-11-22 0501  INR 1.26    No results for input(s): DDIMER in the last 72 hours.  Cardiac Enzymes No results for input(s): CKMB, TROPONINI, MYOGLOBIN in the last 168 hours.  Invalid input(s): CK ------------------------------------------------------------------------------------------------------------------ Invalid input(s): POCBNP    Natassia Guthridge D.O. on 11/17/2014 at 10:05 AM  Between 7am  to 7pm - Pager - [phone]  After 7pm go to [email or web address] - password [email or web address]  And look for the night coverage person covering for me after hours  [facility] Office  [phone]

## 2014-11-17 NOTE — Progress Notes (Signed)
Utilization Review Completed.Bruce Schultz T6/20/2016  

## 2014-11-17 NOTE — Procedures (Addendum)
RErdering Physician : Dr Hosie Poisson  EEG number: [phone]    Data acquisition: 10-20 electrode placement.  Additional T1, T2, and EKG electrodes; 26 channel digital referential acquisition reformatted to 18 channel/7 channel coronal bipolar     Spike detection: ON     Seizure detection: ON   Beginning time: 11/16/14 Ending time: 11/17/14  Day of study: day 1    This 24 hours of intensive EEG monitoring with simultaneous video monitoring was performed for this patient with unresponsiveness and left gaze preference  as a part of ongoing series to capture events of interest and determine if these are seizures.    Medications: as per EMR  There was no pushbutton activations events during this recording.  Automated spike detection program did not detect any spikes  Seizure detection program did not detect any seizures .  EEG technologist noted times where patient felt to have left gaze preference. Those times were not accompanied by any EEG changes to suggest seizures  Background activities were marked by predominantly  delta slowing distributed broadly. Admixed theta frequencies were present intermittently  and more pronounced with stimulation There was no epileptiform discharges clinical or subclinical seizures present.  Muscle twitch artifact was present occasionally but mainly across left  Frontotemporal  region.   Clinical interpretation: This 24 hours of intensive EEG monitoring with simultaneous monitoring did not record any clinical or subclinical seizures. Background activities as discussed above and c/w moderate to severe encephalopathy of non specific etiologies . Left gaze preference was not asociated with ictal activities.   Day 2  11/17/14 11/18/14   There was no pushbutton activations events during this recording.  Automated spike detection program did not detect any spikes  Seizure detection program did not detect any seizures .   Background activities were marked by  predominantly  Delta and theta  slowing distributed broadly. Admixed faster frequencies were present intermittently  and more pronounced with stimulation There was no epileptiform discharges clinical or subclinical seizures present.  Muscle twitch artifact was present occasionally but mainly across left  Frontotemporal  region.   Clinical interpretation: This 24 hours  of intensive EEG monitoring with simultaneous monitoring did not record any clinical or subclinical seizures. Background activities as discussed above and c/w moderate to severe encephalopathy of non specific etiologies .

## 2014-11-17 NOTE — Progress Notes (Signed)
Events noted. Pt off the floor for MRI brain. Will follow up tomorrow.

## 2014-11-17 NOTE — Progress Notes (Signed)
Patient returned from MRI, per Pattricia Boss in EEG, Dr, Leroy Kennedy ok to leave EEG off at this time.   Sim Boast, RN

## 2014-11-17 NOTE — Progress Notes (Signed)
Pt being moved to 4N; tech stopping EEG temporarily for move.

## 2014-11-17 NOTE — Procedures (Signed)
ELECTROENCEPHALOGRAM REPORT   Patient: Bruce Schultz       Room #: 9Y80 EEG No. ID: [id number] Age: 81 y.o.        Sex: male Referring Physician: Catha Gosselin Report Date:  11/17/2014        Interpreting Physician: Thana Farr  History: Bruce Schultz is an 81 y.o. male with involuntary movements and left eye deviation evaluated to rule out seizure.    Medications:  Scheduled: . aspirin  300 mg Rectal BID PC  . docusate sodium  100 mg Oral BID  . escitalopram  10 mg Oral Daily  . levETIRAcetam  500 mg Intravenous Q12H  . pantoprazole (PROTONIX) IV  40 mg Intravenous Q24H  . senna  1 tablet Oral BID  . valproate sodium  500 mg Intravenous Q12H    Conditions of Recording:  This is a 18 channel prolonged EEG with concurrent video monitoring carried out with the patient in the unresponsive state.  Recording was performed on 11/16/2014 from 0730 to 1044.    Description:  The background activity consists mostly of a diffusely distributed polymorphic delta rhythm of low voltage.  Some intermixed theta activity is noted at times that is most prominent with stimulation.  No epileptiform activity is noted, even with eye deviation.  There is noted muscle twitch artifact that is most prominent from the left temporal region.  This activity improved with administration of Ativan.    Hyperventilation and intermittent photic stimulation were not performed.  IMPRESSION: This is an abnormal prolonged EEG monitoring due to the presence of general background slowing consistent with a diffuse disturbance that is etiologically nonspecific but may include a metabolic encephalopathy among other possibilities.  Left gaze preference was not asociated with ictal activities.     Thana Farr, MD [facility] [phone] 11/17/2014, 3:27 PM

## 2014-11-17 NOTE — Progress Notes (Signed)
PT Cancellation Note  Patient Details Name: [name] MRN: [id number] DOB: [birth year]   Cancelled Treatment:    Reason Eval/Treat Not Completed: Medical issues which prohibited therapy (Pt remains on continuous EEG).  Please advise when pt is appropriate for mobility and PT evaluation.  PT will continue to follow acutely.   Michail Jewels PT, DPT [phone] Pager: [phone] 11/17/2014, 10:53 AM

## 2014-11-17 NOTE — Progress Notes (Addendum)
Patient arrived to unit unresponsive, unlabored/symmetrical breathing. Pt with flicker of movement with bilateral lower extremeties. Right pupil slowly reacted to light but not left. Patient failed swallow eval d/t condition at this time. He is currently on continuous EEG. Family at bedside. RN reviewed safety precautions, order and the use of EEG button for possible "siezure" activities. TELE applied and confirmed. Will continue to monitor.   Sim Boast, RN

## 2014-11-17 NOTE — Progress Notes (Signed)
Patient is transferred from room 5N17 to unit 4N06 at this time. Unresponsive and does not follow command at this time. IV sites in place and patient will be transferring with continuous EEG monitoring. Report given to receiving nurse Sim Boast, RN. Transported via bed with all belongings.

## 2014-11-17 NOTE — Clinical Documentation Improvement (Signed)
Renal labs as below.  Please identify any clinical conditions associated with the abnormal lab values and document in your progress notes and carry over to the discharge summary.    Component      Creatinine  Latest Ref Rng      0.61 - 1.24 mg/dL  02/19/3006     6:22 PM 1.20  11/16/2014      1.65 (H)  11/17/2014      1.93 (H)   Possible Clinical Conditions: -Acute kidney injury / acute renal failure -Other condition (please specify) -Unable to determine  Thank you, Doy Mince, RN [phone] Clinical Documentation Specialist

## 2014-11-17 NOTE — Progress Notes (Signed)
RN informed from charge RN that patient will be transferred to [facility] [place]. Oncoming RN made aware and will notify patients family before patient is transferred to [address] [place]. Nursing will continue to monitor.

## 2014-11-17 NOTE — Progress Notes (Signed)
LTM EEG restarted per Dr Leroy Kennedy.

## 2014-11-18 ENCOUNTER — Inpatient Hospital Stay (HOSPITAL_COMMUNITY): Payer: Medicare Other

## 2014-11-18 DIAGNOSIS — R001 Bradycardia, unspecified: Secondary | ICD-10-CM

## 2014-11-18 DIAGNOSIS — R569 Unspecified convulsions: Secondary | ICD-10-CM

## 2014-11-18 DIAGNOSIS — G9340 Encephalopathy, unspecified: Secondary | ICD-10-CM

## 2014-11-18 DIAGNOSIS — I639 Cerebral infarction, unspecified: Secondary | ICD-10-CM

## 2014-11-18 DIAGNOSIS — J9601 Acute respiratory failure with hypoxia: Secondary | ICD-10-CM

## 2014-11-18 DIAGNOSIS — N179 Acute kidney failure, unspecified: Secondary | ICD-10-CM

## 2014-11-18 DIAGNOSIS — I10 Essential (primary) hypertension: Secondary | ICD-10-CM

## 2014-11-18 DIAGNOSIS — I6340 Cerebral infarction due to embolism of unspecified cerebral artery: Secondary | ICD-10-CM | POA: Insufficient documentation

## 2014-11-18 LAB — BASIC METABOLIC PANEL
Anion gap: 5 (ref 5–15)
BUN: 36 mg/dL — AB (ref 6–20)
CHLORIDE: 112 mmol/L — AB (ref 101–111)
CO2: 24 mmol/L (ref 22–32)
Calcium: 7.8 mg/dL — ABNORMAL LOW (ref 8.9–10.3)
Creatinine, Ser: 1.41 mg/dL — ABNORMAL HIGH (ref 0.61–1.24)
GFR calc Af Amer: 50 mL/min — ABNORMAL LOW (ref >60)
GFR calc non Af Amer: 43 mL/min — ABNORMAL LOW (ref >60)
Glucose, Bld: 80 mg/dL (ref 65–99)
POTASSIUM: 4.1 mmol/L (ref 3.5–5.1)
SODIUM: 141 mmol/L (ref 135–145)

## 2014-11-18 LAB — POCT I-STAT 3, ART BLOOD GAS (G3+)
Acid-Base Excess: 3 mmol/L — ABNORMAL HIGH (ref 0.0–2.0)
BICARBONATE: 25.3 meq/L — AB (ref 20.0–24.0)
O2 Saturation: 100 %
PH ART: 7.539 — AB (ref 7.350–7.450)
PO2 ART: 433 mmHg — AB (ref 80.0–100.0)
Patient temperature: 97.7
TCO2: 26 mmol/L (ref 0–100)
pCO2 arterial: 29.6 mmHg — ABNORMAL LOW (ref 35.0–45.0)

## 2014-11-18 LAB — HEMOGLOBIN A1C
HEMOGLOBIN A1C: 5.8 % — AB (ref 4.8–5.6)
MEAN PLASMA GLUCOSE: 120 mg/dL

## 2014-11-18 LAB — VALPROIC ACID LEVEL: VALPROIC ACID LVL: 65 ug/mL (ref 50.0–100.0)

## 2014-11-18 LAB — CBC
HCT: 28.1 % — ABNORMAL LOW (ref 39.0–52.0)
Hemoglobin: 9.4 g/dL — ABNORMAL LOW (ref 13.0–17.0)
MCH: 29.6 pg (ref 26.0–34.0)
MCHC: 33.5 g/dL (ref 30.0–36.0)
MCV: 88.4 fL (ref 78.0–100.0)
PLATELETS: 80 10*3/uL — AB (ref 150–400)
RBC: 3.18 MIL/uL — ABNORMAL LOW (ref 4.22–5.81)
RDW: 14.9 % (ref 11.5–15.5)
WBC: 7.9 10*3/uL (ref 4.0–10.5)

## 2014-11-18 MED ORDER — CHLORHEXIDINE GLUCONATE 0.12 % MT SOLN
15.0000 mL | Freq: Two times a day (BID) | OROMUCOSAL | Status: DC
  Administered 2014-11-18 – 2014-11-23 (×10): 15 mL via OROMUCOSAL
  Filled 2014-11-18 (×10): qty 15

## 2014-11-18 MED ORDER — FENTANYL CITRATE (PF) 100 MCG/2ML IJ SOLN: 50.0000 ug | INTRAMUSCULAR | Status: DC | PRN

## 2014-11-18 MED ORDER — CETYLPYRIDINIUM CHLORIDE 0.05 % MT LIQD
7.0000 mL | Freq: Four times a day (QID) | OROMUCOSAL | Status: DC
  Administered 2014-11-18 – 2014-11-23 (×16): 7 mL via OROMUCOSAL

## 2014-11-18 MED ORDER — FENTANYL CITRATE (PF) 100 MCG/2ML IJ SOLN
100.0000 ug | Freq: Once | INTRAMUSCULAR | Status: AC
  Administered 2014-11-18: 100 ug via INTRAVENOUS

## 2014-11-18 MED ORDER — ETOMIDATE 2 MG/ML IV SOLN
20.0000 mg | Freq: Once | INTRAVENOUS | Status: AC
  Administered 2014-11-18: 20 mg via INTRAVENOUS

## 2014-11-18 MED ORDER — MIDAZOLAM HCL 2 MG/2ML IJ SOLN
2.0000 mg | Freq: Once | INTRAMUSCULAR | Status: AC
  Administered 2014-11-18: 2 mg via INTRAVENOUS

## 2014-11-18 MED ORDER — IOHEXOL 350 MG/ML SOLN
60.0000 mL | Freq: Once | INTRAVENOUS | Status: AC | PRN
  Administered 2014-11-18: 60 mL via INTRAVENOUS

## 2014-11-18 MED ORDER — ESCITALOPRAM OXALATE 10 MG PO TABS
10.0000 mg | ORAL_TABLET | Freq: Every day | ORAL | Status: DC
  Administered 2014-11-19 – 2014-11-20 (×2): 10 mg
  Filled 2014-11-18 (×4): qty 1

## 2014-11-18 MED ORDER — MIDAZOLAM HCL 2 MG/2ML IJ SOLN
INTRAMUSCULAR | Status: AC
  Filled 2014-11-18: qty 2

## 2014-11-18 MED ORDER — FAMOTIDINE IN NACL 20-0.9 MG/50ML-% IV SOLN
20.0000 mg | Freq: Two times a day (BID) | INTRAVENOUS | Status: DC
  Administered 2014-11-18 – 2014-11-20 (×4): 20 mg via INTRAVENOUS
  Filled 2014-11-18 (×10): qty 50

## 2014-11-18 MED ORDER — FENTANYL CITRATE (PF) 100 MCG/2ML IJ SOLN
INTRAMUSCULAR | Status: AC
  Filled 2014-11-18: qty 2

## 2014-11-18 MED ORDER — ROCURONIUM BROMIDE 50 MG/5ML IV SOLN
50.0000 mg | Freq: Once | INTRAVENOUS | Status: AC
  Administered 2014-11-18: 50 mg via INTRAVENOUS
  Filled 2014-11-18: qty 5

## 2014-11-18 NOTE — Consult Note (Signed)
PULMONARY / CRITICAL CARE MEDICINE   Name: Bruce Schultz MRN: [id number] DOB: 07/05/1925    ADMISSION DATE:  11/14/2014 CONSULTATION DATE:  6/21  REFERRING MD :  Catha Gosselin   CHIEF COMPLAINT:  Acute respiratory failure   INITIAL PRESENTATION:   81 year old male admitted 6/18 for repair of right hip fracture. Surgery was unremarkable. On the following day he was found unresponsive w/ clinical evidence of seizure and noted on central tele to be in AF w/ RVR HR 150s. Remained unresponsive w/ on-going neuro work-up suggesting embolic CVA. Transferred to the ICU on 6/21 after becoming bradycardic and no longer protecting airway.   STUDIES:  EEG 6/20: abnormal prolonged EEG monitoring due to the presence of general background slowing consistent with a diffuse disturbance that is etiologically nonspecific but may include a metabolic encephalopathy among other possibilities. Left gaze preference was not asociated with ictal activities.  Renal US 6/20: negative for hydro  MR brain 6/20: 1. Numerous punctate foci of restricted diffusion throughout the brain, with superior MCA territory involvement predominating. Appearance is most suggestive of widespread lacunar infarcts as the sequelae of cardiac or proximal aortic emboli.2. No associated mass effect or acute hemorrhage. 3. Areas of chronic hemorrhage and encephalomalacia in the right hemisphere and moderate for age signal changes suggesting chronic small vessel disease.  SIGNIFICANT EVENTS:   HISTORY OF PRESENT ILLNESS:   81 year old male admitted on 11/20/2014 as a transfer from [facility] with right hip fracture after falling. Past surgical history includes hypertension, hyperlipidemia as well as depression. He underwent right THR on day of admission. On 6/19 he was found in SVT w/ HR 90s and he was unresponsive w/ seizure-like activity. STAT CT head was obtained. Neuro had seen and felt he had had new seizure. F/u EEG obtained later that  day did not show epileptic activity. Remained unresponsive. MRI was performed on 11/17/14 which showed numerous punctate foci of her strict in diffusion throughout the brain with superior MCA territory involvement predominating. Appearance is most suggestive of widespread lacunar infarcts as the sequela of cardiac or proximal aortic emboli. No associated new hemorrhage was noted. On 11/18/14 at 917 AM nursing progress note noted pulse rate of 42 bpm with blood pressure 132/41 and that patient was unresponsive to sternal rub. A rapid response was called. Stroke team was notified. He was transferred to 3M07 intensive care. PCCM asked to assume care.    We are now being asked to see him secondary to possible atrial fibrillation. Upon close inspection of EKG on 11/18/14 at 10:07 AM, there appears to be low-voltage P waves in lead 2 preceding QRS complexes with premature junctional beats. Does not appear to be atrial fibrillation. Heart rate 47 bpm.   PAST MEDICAL HISTORY :   has a past medical history of Hypertension; Hyperlipidemia; and Depression.  has past surgical history that includes Eye surgery and Total hip arthroplasty (Right, 11/19/2014). Prior to Admission medications   Medication Sig Start Date End Date Taking? Authorizing Provider  Artificial Tear Ointment (DRY EYES OP) Place 1 drop into the left eye daily.   Yes Historical Provider, MD  aspirin EC 81 MG tablet Take 81 mg by mouth daily.   Yes Historical Provider, MD  escitalopram (LEXAPRO) 10 MG tablet Take 10 mg by mouth daily. 10/18/14   Historical Provider, MD  lisinopril (PRINIVIL,ZESTRIL) 30 MG tablet Take 30 mg by mouth daily. 10/18/14   Historical Provider, MD   Not on File  FAMILY HISTORY:  has no family status information on file.  SOCIAL HISTORY:    REVIEW OF SYSTEMS:  Unable   VITAL SIGNS: Temp:  [97.6 F (36.4 C)-98.8 F (37.1 C)] 97.7 F (36.5 C) (06/21 0917) Pulse Rate:  [42-66] 42 (06/21 0917) Resp:  [18-20] 20  (06/21 0917) BP: (122-145)/(41-57) 132/41 mmHg (06/21 0917) SpO2:  [100 %] 100 % (06/21 0917) HEMODYNAMICS:   VENTILATOR SETTINGS:   INTAKE / OUTPUT:  Intake/Output Summary (Last 24 hours) at 11/18/14 1126 Last data filed at 11/18/14 0800  Gross per 24 hour  Intake    865 ml  Output    600 ml  Net    265 ml    PHYSICAL EXAMINATION: General:  81 year old male, unresponsive on vent  Neuro:  Unresponsive  HEENT:  Orally intubated  Cardiovascular:  Regular and bradycardic  Lungs:  Scattered rhonchi  Abdomen:  Soft, non-tender  Musculoskeletal:  Intact  Skin:  Scattered areas of ecchymosis   LABS:  CBC  Recent Labs Lab 11/16/14 0520 11/17/14 0510 11/18/14 0602  WBC 12.0* 9.2 7.9  HGB 12.8* 9.9* 9.4*  HCT 38.5* 29.0* 28.1*  PLT 128* 101* 80*   Coag's  Recent Labs Lab 12-11-2014 0501  APTT 32  INR 1.26   BMET  Recent Labs Lab 11/16/14 0520 11/17/14 0510 11/18/14 0602  NA 139 135 141  K 4.3 4.6 4.1  CL 109 107 112*  CO2 11* 23 24  BUN 24* 41* 36*  CREATININE 1.65* 1.93* 1.41*  GLUCOSE 208* 104* 80   Electrolytes  Recent Labs Lab 11/16/14 0520 11/17/14 0510 11/18/14 0602  CALCIUM 8.8* 7.6* 7.8*  MG 1.9  --   --    Sepsis Markers No results for input(s): LATICACIDVEN, PROCALCITON, O2SATVEN in the last 168 hours. ABG No results for input(s): PHART, PCO2ART, PO2ART in the last 168 hours. Liver Enzymes  Recent Labs Lab 11/16/14 0520  AST 41  ALT 18  ALKPHOS 36*  BILITOT 1.0  ALBUMIN 2.5*   Cardiac Enzymes No results for input(s): TROPONINI, PROBNP in the last 168 hours. Glucose No results for input(s): GLUCAP in the last 168 hours.  Imaging Mr Brain Wo Contrast  11/17/2014   CLINICAL DATA:  81 year old male with acute onset altered mental status, suspected new onset seizure of unclear etiology. Unresponsive. Initial encounter.  EXAM: MRI HEAD WITHOUT CONTRAST  TECHNIQUE: Multiplanar, multiecho pulse sequences of the brain and  surrounding structures were obtained without intravenous contrast.  COMPARISON:  Head CTs without contrast 10/1914 and 11/14/2014  FINDINGS: There are numerous small foci of restricted diffusion scattered in both cerebral hemispheres, primarily the MCA territories along the superior aspect of both hemispheres. There is some bilateral deep gray matter nuclei involvement, primarily both thalami. There is PCA territory involvement bilaterally. The brainstem is relatively spared. There is occasional right cerebellar hemisphere involvement.  No associated acute hemorrhage or mass effect.  There is a 3-4 cm area of chronic hemorrhage in encephalomalacia in the medial right inferior frontal lobe. There is Patchy and confluent bilateral cerebral white matter T2 and FLAIR hyperintensity. Smaller focus of chronic hemorrhage in the posterior right operculum. Occasional chronic micro hemorrhages elsewhere. T2 heterogeneity in the deep gray matter nuclei mostly resembles perivascular spaces. Occasional chronic lacunar infarcts in the cerebellum. Mild for age patchy T2 and FLAIR hyperintensity in the brainstem.  Major intracranial vascular flow voids are preserved, dominant appearing distal right vertebral artery  No definite signal abnormality in the hippocampal formations or mesial  temporal lobes. No intracranial mass effect. No ventriculomegaly. Negative pituitary. Cervicomedullary junction remarkable for degenerative ligamentous hypertrophy about the odontoid. Normal bone marrow signal.  Visible internal auditory structures appear normal. Mastoids are clear. Paranasal sinuses are clear. Negative orbit and scalp soft tissues.  IMPRESSION: 1. Numerous punctate foci of restricted diffusion throughout the brain, with superior MCA territory involvement predominating. Appearance is most suggestive of widespread lacunar infarcts as the sequelae of cardiac or proximal aortic emboli. 2. No associated mass effect or acute hemorrhage.  3. Areas of chronic hemorrhage and encephalomalacia in the right hemisphere and moderate for age signal changes suggesting chronic small vessel disease.   Electronically Signed   By: Odessa Fleming M.D.   On: 11/17/2014 13:06   US Renal  11/17/2014   CLINICAL DATA:  Acute kidney injury.  EXAM: RENAL / URINARY TRACT ULTRASOUND COMPLETE  COMPARISON:  None.  FINDINGS: Right Kidney:  Length: 8.3 cm. Right kidney is small. Negative for hydronephrosis. Echogenicity of the right kidney is within normal limits.  Left Kidney:  Length: 9.1 cm. Negative for left hydronephrosis. Echogenicity in the left kidney is within normal limits. Left kidney lower pole is not well visualized.  Bladder:  Urinary bladder is decompressed with a Foley catheter.  IMPRESSION: Negative for hydronephrosis.   Electronically Signed   By: Richarda Overlie M.D.   On: 11/17/2014 15:33     ASSESSMENT / PLAN:  PULMONARY OETT 6/21>>> A: Ventilator dependence in setting of inadequate airway protection  P:   Full vent support  F/u abg and cxr Not candidate for trach/peg   CARDIOVASCULAR CVL A:  PAF on 6/19  Now bradycardia 6/21 P:  Cont tele Avoid AV blocking agents Supportive care   RENAL A:   AKI-->improving  P:   Keep euvolemic  Strict I&O Hold ACE-I  Renal dose meds   GASTROINTESTINAL A:   Prob protein cal malnutrition  P:   Supportive care   HEMATOLOGIC A:   Anemia of critical illness Thrombocytopenia  P:  Trend cbc  PAS    INFECTIOUS A:   No acute   P:   Blood cultures   ENDOCRINE A:   No acute  P:   Trend glucose   NEUROLOGIC A:   Embolic CVA Probable seizure  COMA P:   RASS goal: -2 PAD protocol  Cont AEDs  FAMILY  - Updates: via phone, Now DNR   - Inter-disciplinary family meet or Palliative Care meeting due by:  6/28    TODAY'S SUMMARY: 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 Now intubated. Made DNR. We will support for now but need to transition to comfort.   Simonne Martinet ACNP-BC [facility] Pulmonary/Critical Care Pager # [phone] OR # [phone] if no answer     11/18/2014, 11:26 AM   Patient seen and examined, transmitted upper airway sounds in the lungs consistent with inability to protect his airway.  Multiple CVAs on MRI that I reviewed myself.  Discussed with Dr. Pearlean Brownie.  This is likely related to transient a-fib that has now resolved.  Neuro and cardiology to address anti-coag.  I had an extensive conversation with daughter and wife over the phone.  After a long discussion, they recognize that the patient is dying and will not recover from this.  Make DNR, no further escalation of care.  Intubate for a few days and if not dramatic improvement then will terminally extubate and transition to comfort care.  The patient is critically ill  with multiple organ systems failure and requires high complexity decision making for assessment and support, frequent evaluation and titration of therapies, application of advanced monitoring technologies and extensive interpretation of multiple databases.   Critical Care Time devoted to patient care services described in this note is  35  Minutes. This time reflects time of care of this signee Dr Jennet Maduro. This critical care time does not reflect procedure time, or teaching time or supervisory time of PA/NP/Med student/Med Resident etc but could involve care discussion time.  Rush Farmer, M.D. [facility] Pulmonary/Critical Care Medicine. Pager: [phone]. After hours pager: [phone].

## 2014-11-18 NOTE — Progress Notes (Addendum)
   11/18/14 0917  Vitals  Temp 97.7 F (36.5 C)  Temp Source Axillary  BP (!) 132/41 mmHg  BP Location Right Arm  BP Method Automatic  Patient Position (if appropriate) Lying  Pulse Rate (!) 42  Pulse Rate Source Dinamap  Resp 20  Oxygen Therapy  SpO2 100 %  O2 Device Nasal Cannula  O2 Flow Rate (L/min) 2 L/min  Consulting civil engineer Hydrographic surveyor) notified of pt current status, upon examination, pt had HR sustaining in high 30's and low 40's. Pt unresponsive to sternal rub.  Rapid Response and AC notified.  Attending MD and Stroke Team notified, CCM consulted and evaluated the pt.  Transfer orders placed, room assignment  3M07.  RN advised to call report to oncoming RN.  Pt transferred at 1038.

## 2014-11-18 NOTE — Progress Notes (Signed)
   Subjective:  MRI brain showed large embolic stroke. Transferred to ICU today due to bradycardia and inability to protect airway. Intubated and sedated.   Objective:   VITALS:   Filed Vitals:   11/18/14 1830 11/18/14 1900 11/18/14 1930 11/18/14 2000  BP: 156/55 153/57 128/45 161/54  Pulse: 40 52 36 36  Temp:      TempSrc:      Resp: 12 12 12 12   Height:      SpO2: 100% 100% 100% 100%    Does not follow commands Spontaneously moves R foot/ankle Intact pulses distally Incision: dressing C/D/I   Lab Results  Component Value Date   WBC 7.9 11/18/2014   HGB 9.4* 11/18/2014   HCT 28.1* 11/18/2014   MCV 88.4 11/18/2014   PLT 80* 11/18/2014   BMET    Component Value Date/Time   NA 141 11/18/2014 0602   K 4.1 11/18/2014 0602   CL 112* 11/18/2014 0602   CO2 24 11/18/2014 0602   GLUCOSE 80 11/18/2014 0602   BUN 36* 11/18/2014 0602   CREATININE 1.41* 11/18/2014 0602   CALCIUM 7.8* 11/18/2014 0602   GFRNONAA 43* 11/18/2014 0602   GFRAA 50* 11/18/2014 0602     Assessment/Plan: 3 Days Post-Op   Principal Problem:   Closed right hip fracture Active Problems:   Hypertension   Fall   Anemia   Bradycardia, sinus   Femoral neck fracture   Displaced fracture of right femoral neck   Seizure   Cerebral embolism with cerebral infarction   Acute encephalopathy   Acute respiratory failure with hypoxemia   Cont medical care Will follow   Dyan Creelman, Cloyde Reams 11/18/2014, 8:25 PM   Samson Frederic, MD Cell [phone]

## 2014-11-18 NOTE — Evaluation (Addendum)
Clinical/Bedside Swallow Evaluation Patient Details  Name: Bruce Schultz MRN: [id number] Date of Birth: 11/04/1925  Today's Date: 11/18/2014 Time: SLP Start Time (ACUTE ONLY): 0845 SLP Stop Time (ACUTE ONLY): 0900 SLP Time Calculation (min) (ACUTE ONLY): 15 min  Past Medical History:  Past Medical History  Diagnosis Date  . Hypertension   . Hyperlipidemia   . Depression    Past Surgical History:  Past Surgical History  Procedure Laterality Date  . Eye surgery      CATARACT....2015  . Total hip arthroplasty Right 23-Nov-2014    Procedure: TOTAL HIP ARTHROPLASTY ANTERIOR APPROACH;  Surgeon: Samson Frederic, MD;  Location: [facility] OR;  Service: Orthopedics;  Laterality: Right;   HPI:  Pt is an 82 y.o. male with PMH of HTN taken to [facility] for surgical intervention on 6/18 for R hip fracture following a fall. Following surgery on 6/19 pt was found unresponsive in bed with L gaze deviation and facial twitching. MRI 6/20 revealed numerous punctate foci of restricted diffusion throughout the brain, predominantly superior MCA territory involvement, some gray matter nuclei involvement- primarily both thalami. Pt did not pass stroke swallow screen. Bedside swallow eval ordered to assess swallow function and identify safest diet.   Assessment / Plan / Recommendation Clinical Impression  Pt minimally arousable at bedside- would not open eyes or respond to sternal rub. Pt did close mouth around oral swab and moaned; removal of material from palate elicited cough response. Appears that pt bit L side of buccal cavity and lip. Given these findings pt is at high risk of aspiration- was not safe to attempt PO trials at this time. Recommend that pt remain strictly NPO with the exception of frequent oral care, meds via alternative means. Will continue to follow for PO readiness.    Aspiration Risk  Severe    Diet Recommendation NPO   Medication Administration: Via alternative means    Other  Recommendations  Oral Care Recommendations: Oral care QID Other Recommendations: Have oral suction available   Follow Up Recommendations       Frequency and Duration min 2x/week  2 weeks   Pertinent Vitals/Pain n/a    SLP Swallow Goals     Swallow Study Prior Functional Status       General Other Pertinent Information: Pt is an 81 y.o. male with PMH of HTN taken to [facility] for surgical intervention on 6/18 for R hip fracture following a fall. Following surgery on 6/19 pt was found unresponsive in bed with L gaze deviation and facial twitching. MRI 6/20 revealed numerous punctate foci of restricted diffusion throughout the brain, predominantly superior MCA territory involvement, some gray matter nuclei involvement- primarily both thalami. Pt did not pass stroke swallow screen. Bedside swallow eval ordered to assess swallow function and identify safest diet. Type of Study: Bedside swallow evaluation Diet Prior to this Study: NPO Temperature Spikes Noted: No Respiratory Status: Room air History of Recent Intubation: No Behavior/Cognition: Other (Comment);Lethargic/Drowsy (minimally arousable) Oral Cavity - Dentition: Poor condition;Missing dentition Self-Feeding Abilities: Other (Comment) (did not provide PO) Patient Positioning: Upright in bed Baseline Vocal Quality: Low vocal intensity Volitional Cough: Cognitively unable to elicit Volitional Swallow: Unable to elicit    Oral/Motor/Sensory Function Overall Oral Motor/Sensory Function: Other (comment) (limited assessment) Labial Strength: Within Functional Limits (pt adequately closed lips firmly around swab)   Ice Chips Ice chips: Not tested   Thin Liquid Thin Liquid: Not tested    Nectar Thick Nectar Thick Liquid: Not tested   Honey Thick  Honey Thick Liquid: Not tested   Puree Puree: Not tested   Solid   GO    Solid: Not tested       Metro Kung, MA, CCC-SLP 11/18/2014,9:07 AM  Page: [phone]

## 2014-11-18 NOTE — Consult Note (Signed)
Admit date: 11/14/2014 Referring Physician  Dr. Catha Gosselin Primary Physician Colon Branch, MD Primary Cardiologist  New  Reason for Consultation  possible AFIB  HPI: 81 year old male admitted on 2014-11-20 as a transfer from [facility] with right hip fracture after falling. Past surgical history includes hypertension, hyperlipidemia as well as depression.  On 11/18/14 at 917 AM nursing progress note noted pulse rate of 42 bpm with blood pressure 132/41 and that patient was unresponsive to sternal rub. A rapid response was called. Stroke team was notified. He was transferred to 3M07 intensive care.  Prior to this episode, yesterday afternoon neurology note notes that he had involuntary movements and left eye deviation, EEG was performed to rule out seizure. Acute encephalopathy noted.  MRI was performed on 11/17/14 which showed numerous punctate foci of her strict in diffusion throughout the brain with superior MCA territory involvement predominating. Appearance is most suggestive of widespread lacunar infarcts as the sequela of cardiac or proximal aortic emboli. No associated new hemorrhage was noted.  We are now being asked to see him secondary to possible atrial fibrillation. Upon close inspection of EKG on 11/18/14 at 10:07 AM, there appears to be low-voltage P waves in lead 2 preceding QRS complexes with premature junctional beats. Does not appear to be atrial fibrillation. Heart rate 47 bpm.  I also reviewed telemetry quite closely and there does not appear to be atrial fibrillation. Sinus bradycardia.   PMH:   Past Medical History  Diagnosis Date  . Hypertension   . Hyperlipidemia   . Depression     PSH:   Past Surgical History  Procedure Laterality Date  . Eye surgery      CATARACT....2015  . Total hip arthroplasty Right 2014-11-20    Procedure: TOTAL HIP ARTHROPLASTY ANTERIOR APPROACH;  Surgeon: Samson Frederic, MD;  Location: [facility] OR;  Service: Orthopedics;  Laterality:  Right;   Allergies:  Review of patient's allergies indicates not on file. Prior to Admit Meds:   Prior to Admission medications   Medication Sig Start Date End Date Taking? Authorizing Provider  Artificial Tear Ointment (DRY EYES OP) Place 1 drop into the left eye daily.   Yes Historical Provider, MD  aspirin EC 81 MG tablet Take 81 mg by mouth daily.   Yes Historical Provider, MD  escitalopram (LEXAPRO) 10 MG tablet Take 10 mg by mouth daily. 10/18/14   Historical Provider, MD  lisinopril (PRINIVIL,ZESTRIL) 30 MG tablet Take 30 mg by mouth daily. 10/18/14   Historical Provider, MD   Fam HX:    Family History  Problem Relation Age of Onset  . Family history unknown: Yes   Social HX:    History   Social History  . Marital Status: Unknown    Spouse Name: N/A  . Number of Children: N/A  . Years of Education: N/A   Occupational History  . Not on file.   Social History Main Topics  . Smoking status: Not on file  . Smokeless tobacco: Not on file  . Alcohol Use: Not on file  . Drug Use: Not on file  . Sexual Activity: Not on file   Other Topics Concern  . Not on file   Social History Narrative     ROS:  Unable to assess    Physical Exam: Blood pressure 132/41, pulse 42, temperature 97.7 F (36.5 C), temperature source Axillary, resp. rate 20, SpO2 100 %.   General: Thin, ill-appearing Head: Eyes PERRLA, No xanthomas.   Normal cephalic and  atramatic  Lungs:   Clear bilaterally to auscultation and percussion. Normal respiratory effort. No wheezes, no rales. Heart:   Bradycardic regular with occasional ectopy Pulses are 2+ & equal. No murmur, rubs, gallops.  No carotid bruit. No JVD.  No abdominal bruits.  Abdomen: Bowel sounds are positive, abdomen soft and non-tender without masses. No hepatosplenomegaly. Msk:  Unable to assess. Extremities:  No clubbing, cyanosis or edema.  DP +1 Neuro: Fairly obtunded GU: Deferred Rectal: Deferred Psych:  Obtunded       Labs: Lab Results  Component Value Date   WBC 7.9 11/18/2014   HGB 9.4* 11/18/2014   HCT 28.1* 11/18/2014   MCV 88.4 11/18/2014   PLT 80* 11/18/2014     Recent Labs Lab 11/16/14 0520  11/18/14 0602  NA 139  < > 141  K 4.3  < > 4.1  CL 109  < > 112*  CO2 11*  < > 24  BUN 24*  < > 36*  CREATININE 1.65*  < > 1.41*  CALCIUM 8.8*  < > 7.8*  PROT 5.5*  --   --   BILITOT 1.0  --   --   ALKPHOS 36*  --   --   ALT 18  --   --   AST 41  --   --   GLUCOSE 208*  < > 80  < > = values in this interval not displayed. No results for input(s): CKTOTAL, CKMB, TROPONINI in the last 72 hours. Lab Results  Component Value Date   CHOL 132 11/17/2014   HDL 34* 11/17/2014   LDLCALC 80 11/17/2014   TRIG 92 11/17/2014   No results found for: [facility]   Radiology:  Mr Brain Wo Contrast  11/17/2014   CLINICAL DATA:  81 year old male with acute onset altered mental status, suspected new onset seizure of unclear etiology. Unresponsive. Initial encounter.  EXAM: MRI HEAD WITHOUT CONTRAST  TECHNIQUE: Multiplanar, multiecho pulse sequences of the brain and surrounding structures were obtained without intravenous contrast.  COMPARISON:  Head CTs without contrast 10/1914 and 11/14/2014  FINDINGS: There are numerous small foci of restricted diffusion scattered in both cerebral hemispheres, primarily the MCA territories along the superior aspect of both hemispheres. There is some bilateral deep gray matter nuclei involvement, primarily both thalami. There is PCA territory involvement bilaterally. The brainstem is relatively spared. There is occasional right cerebellar hemisphere involvement.  No associated acute hemorrhage or mass effect.  There is a 3-4 cm area of chronic hemorrhage in encephalomalacia in the medial right inferior frontal lobe. There is Patchy and confluent bilateral cerebral white matter T2 and FLAIR hyperintensity. Smaller focus of chronic hemorrhage in the posterior right operculum.  Occasional chronic micro hemorrhages elsewhere. T2 heterogeneity in the deep gray matter nuclei mostly resembles perivascular spaces. Occasional chronic lacunar infarcts in the cerebellum. Mild for age patchy T2 and FLAIR hyperintensity in the brainstem.  Major intracranial vascular flow voids are preserved, dominant appearing distal right vertebral artery  No definite signal abnormality in the hippocampal formations or mesial temporal lobes. No intracranial mass effect. No ventriculomegaly. Negative pituitary. Cervicomedullary junction remarkable for degenerative ligamentous hypertrophy about the odontoid. Normal bone marrow signal.  Visible internal auditory structures appear normal. Mastoids are clear. Paranasal sinuses are clear. Negative orbit and scalp soft tissues.  IMPRESSION: 1. Numerous punctate foci of restricted diffusion throughout the brain, with superior MCA territory involvement predominating. Appearance is most suggestive of widespread lacunar infarcts as the sequelae of cardiac or proximal  aortic emboli. 2. No associated mass effect or acute hemorrhage. 3. Areas of chronic hemorrhage and encephalomalacia in the right hemisphere and moderate for age signal changes suggesting chronic small vessel disease.   Electronically Signed   By: Odessa Fleming M.D.   On: 11/17/2014 13:06   US Renal  11/17/2014   CLINICAL DATA:  Acute kidney injury.  EXAM: RENAL / URINARY TRACT ULTRASOUND COMPLETE  COMPARISON:  None.  FINDINGS: Right Kidney:  Length: 8.3 cm. Right kidney is small. Negative for hydronephrosis. Echogenicity of the right kidney is within normal limits.  Left Kidney:  Length: 9.1 cm. Negative for left hydronephrosis. Echogenicity in the left kidney is within normal limits. Left kidney lower pole is not well visualized.  Bladder:  Urinary bladder is decompressed with a Foley catheter.  IMPRESSION: Negative for hydronephrosis.   Electronically Signed   By: Richarda Overlie M.D.   On: 11/17/2014 15:33    Personally viewed.  EKG:  As described above. Personally viewed.   ASSESSMENT/PLAN:    81 year old male with right hip fracture, bradycardia, encephalopathy.  1. Bradycardia-upon close inspection of EKG there appear to be broad-based low-voltage P waves preceding QRS complex especially seen in lead 2 on rhythm strip. Junctional premature beats are also noted. I do not believe that this is atrial fibrillation. Nonetheless, his bradycardia is fairly significant. He is able to maintain his blood pressure quite well. Avoid AV nodal blocking agent such as beta blockers or calcium channel blockers. He is currently off of antihypertensives, ACE inhibitor. I wonder if his bradycardia is enhanced by current neurologic situation? No further cardiology intervention at this time. No pacemaker necessary at this time. Discussed with critical care team, nursing.  2. Encephalopathy-per neurology  3. Hip fracture-per orthopedics  4. Essential hypertension-currently holding ACE inhibitor.  5. Recent acute kidney injury-creatinine chanting downward. Off ACE inhibitor.  Please let us know if we can be of further assistance., We will sign off.  Donato Schultz, MD  11/18/2014  10:55 AM

## 2014-11-18 NOTE — Progress Notes (Signed)
STROKE TEAM PROGRESS NOTE   HISTORY Bruce Schultz is an 81 y.o. male hx of HTN, HLD, depression admitted on 6/18 after suffering a fall at home with resultant R femoral neck fracture. Had successful surgical repair on 6/18 with no complications. Post procedure was doing well. LSW 0130 on 6/19. Around 0430 noted to be unresponsive with left gaze deviation, would withdrawal symmetrically to noxious stimuli. Shortly after RN noted facial twitching which progressed to involve his head turned to the left with right arm extended and repetitive flexion of LUE. Episode lasted around 30 seconds to 1 minute. Eyes midline after episode but he remains unresponsive. He received ativan  x 1 and loaded with keppra  x 1. Seen by neurology. Following day seizure activity has slowed the patient remained unresponsive. Continuous EEG monitoring started an MRI ordered. MRI showed small bilateral cortical and subcortical infarcts. Patient was transferred to the stroke service for follow-up.   SUBJECTIVE (INTERVAL HISTORY) Patient transferred to neuro ICU today due to bradycardia with heart rates in the 30s to 40s and unresponsiveness with inability to protect his airway. Possibility for atrial fibrillation though could be slow bradycardia. Inconsistent P waves noted. Patient was intubated just prior to arrival and paralyzed.   OBJECTIVE Temp:  [97.4 F (36.3 C)-98.4 F (36.9 C)] 97.4 F (36.3 C) (06/21 1555) Pulse Rate:  [37-66] 39 (06/21 1600) Cardiac Rhythm:  [-] Sinus bradycardia (06/21 1600) Resp:  [8-23] 8 (06/21 1600) BP: (122-175)/(41-57) 145/44 mmHg (06/21 1600) SpO2:  [100 %] 100 % (06/21 1600) FiO2 (%):  [30 %-100 %] 30 % (06/21 1600)  No results for input(s): GLUCAP in the last 168 hours.  Recent Labs Lab 2014/12/13 1634 11/16/14 0520 11/17/14 0510 11/18/14 0602  NA 139 139 135 141  K 5.8* 4.3 4.6 4.1  CL 103 109 107 112*  CO2  --  11* 23 24  GLUCOSE 86 208* 104* 80  BUN 28* 24* 41*  36*  CREATININE 1.20 1.65* 1.93* 1.41*  CALCIUM  --  8.8* 7.6* 7.8*  MG  --  1.9  --   --     Recent Labs Lab 11/16/14 0520  AST 41  ALT 18  ALKPHOS 36*  BILITOT 1.0  PROT 5.5*  ALBUMIN 2.5*    Recent Labs Lab 2014-12-13 0501 12-13-2014 1634 11/16/14 0520 11/17/14 0510 11/18/14 0602  WBC 6.8  --  12.0* 9.2 7.9  NEUTROABS 5.0  --   --   --   --   HGB 12.3* 14.3 12.8* 9.9* 9.4*  HCT 37.3* 42.0 38.5* 29.0* 28.1*  MCV 88.2  --  89.1 87.9 88.4  PLT 136*  --  128* 101* 80*   No results for input(s): CKTOTAL, CKMB, CKMBINDEX, TROPONINI in the last 168 hours. No results for input(s): LABPROT, INR in the last 72 hours.  Recent Labs  11/16/14 1308  COLORURINE AMBER*  LABSPEC 1.037*  PHURINE 5.0  GLUCOSEU NEGATIVE  HGBUR LARGE*  BILIRUBINUR NEGATIVE  KETONESUR 15*  PROTEINUR 100*  UROBILINOGEN 0.2  NITRITE NEGATIVE  LEUKOCYTESUR SMALL*       Component Value Date/Time   CHOL 132 11/17/2014 1620   TRIG 92 11/17/2014 1620   HDL 34* 11/17/2014 1620   CHOLHDL 3.9 11/17/2014 1620   VLDL 18 11/17/2014 1620   LDLCALC 80 11/17/2014 1620   Lab Results  Component Value Date   HGBA1C 5.8* 11/17/2014   No results found for: LABOPIA, COCAINSCRNUR, LABBENZ, AMPHETMU, THCU, LABBARB  No results for  input(s): ETH in the last 168 hours.  Mr Brain Wo Contrast  11/17/2014   CLINICAL DATA:  81 year old male with acute onset altered mental status, suspected new onset seizure of unclear etiology. Unresponsive. Initial encounter.  EXAM: MRI HEAD WITHOUT CONTRAST  TECHNIQUE: Multiplanar, multiecho pulse sequences of the brain and surrounding structures were obtained without intravenous contrast.  COMPARISON:  Head CTs without contrast 10/1914 and 11/14/2014  FINDINGS: There are numerous small foci of restricted diffusion scattered in both cerebral hemispheres, primarily the MCA territories along the superior aspect of both hemispheres. There is some bilateral deep gray matter nuclei  involvement, primarily both thalami. There is PCA territory involvement bilaterally. The brainstem is relatively spared. There is occasional right cerebellar hemisphere involvement.  No associated acute hemorrhage or mass effect.  There is a 3-4 cm area of chronic hemorrhage in encephalomalacia in the medial right inferior frontal lobe. There is Patchy and confluent bilateral cerebral white matter T2 and FLAIR hyperintensity. Smaller focus of chronic hemorrhage in the posterior right operculum. Occasional chronic micro hemorrhages elsewhere. T2 heterogeneity in the deep gray matter nuclei mostly resembles perivascular spaces. Occasional chronic lacunar infarcts in the cerebellum. Mild for age patchy T2 and FLAIR hyperintensity in the brainstem.  Major intracranial vascular flow voids are preserved, dominant appearing distal right vertebral artery  No definite signal abnormality in the hippocampal formations or mesial temporal lobes. No intracranial mass effect. No ventriculomegaly. Negative pituitary. Cervicomedullary junction remarkable for degenerative ligamentous hypertrophy about the odontoid. Normal bone marrow signal.  Visible internal auditory structures appear normal. Mastoids are clear. Paranasal sinuses are clear. Negative orbit and scalp soft tissues.  IMPRESSION: 1. Numerous punctate foci of restricted diffusion throughout the brain, with superior MCA territory involvement predominating. Appearance is most suggestive of widespread lacunar infarcts as the sequelae of cardiac or proximal aortic emboli. 2. No associated mass effect or acute hemorrhage. 3. Areas of chronic hemorrhage and encephalomalacia in the right hemisphere and moderate for age signal changes suggesting chronic small vessel disease.   Electronically Signed   By: Odessa Fleming M.D.   On: 11/17/2014 13:06   US Renal  11/17/2014   CLINICAL DATA:  Acute kidney injury.  EXAM: RENAL / URINARY TRACT ULTRASOUND COMPLETE  COMPARISON:  None.   FINDINGS: Right Kidney:  Length: 8.3 cm. Right kidney is small. Negative for hydronephrosis. Echogenicity of the right kidney is within normal limits.  Left Kidney:  Length: 9.1 cm. Negative for left hydronephrosis. Echogenicity in the left kidney is within normal limits. Left kidney lower pole is not well visualized.  Bladder:  Urinary bladder is decompressed with a Foley catheter.  IMPRESSION: Negative for hydronephrosis.   Electronically Signed   By: Richarda Overlie M.D.   On: 11/17/2014 15:33   Portable Chest Xray  11/18/2014   CLINICAL DATA:  Hypoxia  EXAM: PORTABLE CHEST - 1 VIEW  COMPARISON:  November 14, 2014  FINDINGS: Endotracheal tube tip is 2.9 cm above the carina. Central catheter tip is in the superior vena cava. There is no pneumothorax. There is no edema or consolidation. The heart size and pulmonary vascularity are normal. No adenopathy. No bone lesions.  IMPRESSION: Tube and catheter positions as described without pneumothorax. No edema or consolidation.   Electronically Signed   By: Bretta Bang III M.D.   On: 11/18/2014 12:50   2-D echocardiogram - Procedure narrative: Transthoracic echocardiography. Imagequality was suboptimal. The study was technically difficult, as aresult of poor acoustic windows, poor sound wave transmission,chest  wall deformity, and body habitus. - Left ventricle: Can not comment on function based on currentimages.   PHYSICAL EXAM Frail elderly male sedated and intubated. . Afebrile. Head is nontraumatic. Neck is supple without bruit.    Cardiac exam no murmur or gallop. Lungs are clear to auscultation. Distal pulses are well felt. Neurological Exam limited as patient recently sedated for intubation. Unresponsive pupils small irregular sluggishly reactive. Left eye corneal opacity. Fundi could not be visualized. Tongue midline. Doll`s eye movements are sluggish. Weak corneals , cough and gag. Minimal motor response to noxious stimuli in all 4  limbs. ASSESSMENT/PLAN Bruce Schultz is a 81 y.o. male with history of chronic knee pain who tripped and fell presenting with right femoral neck fracture status post right total hip arthroplasty 6/18 he developed unresponsiveness and left gaze deviation followed by left upper body seizure. MRI showed bilateral small infarcts.  He did not receive IV t-PA due to nonfocal presentation.  Small bilateral infarts, felt to be cardioembolic versus embolic from fat emboli post hip fracture/fall  MRI  Small bilateral infarcts. small vessel disease   CTA head and neck ordered  2D Echo  Study of poor quality. LV not seen well  Possible TEE to assess for embolic source if PAF not verified  LDL 80  HgbA1c 5.8  SCDs for VTE prophylaxis  Diet NPO time specified  aspirin 81 mg orally every day prior to admission, now on aspirin 300 mg suppository daily  Ongoing aggressive stroke risk factor management  Therapy recommendations:  pending   Disposition:  pending   Seizures  On Depacon 500 mg to 12 and Keppra 500 every 12  EEG short activity versus epileptiform activity  Valproic acid level 34   Hypertension  Stable  Hyperlipidemia  Home meds:  No statin   LDL 80, goal < 70  Add statin  Continue statin at discharge  Other Stroke Risk Factors  Advanced age  Other Active Problems  Questionable paroxysmal atrial fibrillation, now bradycardic  Ventilator dependence in setting of inadequate airway protection  Acute kidney injury  Patient made DO NOT RESUSCITATE today  Hospital day # 4  BIBY,SHARON  [facility] St[facility] Center See Amion for Pager information 11/18/2014 5:39 PM  I have personally examined this patient, reviewed notes, independently viewed imaging studies, participated in medical decision making and plan of care. I have made any additions or clarifications directly to the above note. Agree with note above. He developed mental status changes post hip  arthroplasty with MRI showing multiple tiny bilateral scattered DWI positive lesions -fat emboli versus e,mbolic stroke. He remains at risk for neurological worsening and needs ongoing stroke evaluation with CT angio, TEE and EEG.  This patient is critically ill and at significant risk of neurological worsening, death and care requires constant monitoring of vital signs, hemodynamics,respiratory and cardiac monitoring, extensive review of multiple databases, frequent neurological assessment, discussion with family, other specialists and medical decision making of high complexity.I have made any additions or clarifications directly to the above note.  I spent 30 minutes of neurocritical care time  in the care of  this patient.      Delia Heady, MD Medical Director [facility] Stroke Center Pager: [phone] 11/18/2014 6:36 PM    To contact Stroke Continuity provider, please refer to [email or web address]. After hours, contact General Neurology

## 2014-11-18 NOTE — Progress Notes (Signed)
  Echocardiogram 2D Echocardiogram has been performed.  Bruce Schultz 11/18/2014, 3:54 PM

## 2014-11-18 NOTE — Procedures (Signed)
Central Venous Catheter Insertion Procedure Note Bruce Schultz [id number] Mar 29, 1926  Procedure: Insertion of Central Venous Catheter Indications: Assessment of intravascular volume, Drug and/or fluid administration and Frequent blood sampling  Procedure Details Consent: Risks of procedure as well as the alternatives and risks of each were explained to the (patient/caregiver).  Consent for procedure obtained. Time Out: Verified patient identification, verified procedure, site/side was marked, verified correct patient position, special equipment/implants available, medications/allergies/relevent history reviewed, required imaging and test results available.  Performed  Maximum sterile technique was used including antiseptics, cap, gloves, gown, hand hygiene, mask and sheet. Skin prep: Chlorhexidine; local anesthetic administered A antimicrobial bonded/coated triple lumen catheter was placed in the left internal jugular vein using the Seldinger technique. Ultrasound guidance used.Yes.   Catheter placed to 20 cm. Blood aspirated via all 3 ports and then flushed x 3. Line sutured x 2 and dressing applied.  Evaluation Blood flow good Complications: No apparent complications Patient did tolerate procedure well. Chest X-ray ordered to verify placement.  CXR: pending.  Brett Canales Minor ACNP [facility] PCCM Pager [phone] till 3 pm If no answer page [phone] 11/18/2014, 11:28 AM  I was present, supervised and directed the entire procedure.  Alyson Reedy, M.D. [facility] Pulmonary/Critical Care Medicine. Pager: [phone]. After hours pager: [phone]

## 2014-11-18 NOTE — Evaluation (Signed)
Physical Therapy Evaluation Patient Details Name: Bruce Schultz MRN: [id number] DOB: Jul 02, 1925 Today's Date: 11/18/2014   History of Present Illness  81 y.o. male with PMH of HTN taken to [facility] for surgical intervention on 6/18 for R hip fracture following a fall. Following surgery on 6/19 pt was found unresponsive in bed with L gaze deviation and facial twitching. MRI 6/20 revealed numerous punctate foci of restricted diffusion throughout the brain, predominantly superior MCA territory involvement, some gray matter nuclei involvement- primarily both thalami.   Clinical Impression  Patient seen for evaluation and there activity post THA complicated by CVA.  Patient currently unresponsive to sternal rub, verbal stimulus, and repositioning. Patient did withdrawal to nail bed pressure and grimaced with RLE ROM. At this time, patient's ability to arouse is large limiting factor. Will trial acute PT services to facilitate activity and attempt to improved participation. Will see as indicated and progress as tolerated. If patient arouses and is able to engage in appropriate level of activity, may consider CIR consult. At this time, follow up recommendation TBD based on progress.    Follow Up Recommendations CIR (based on complexity of dx but will monitor ability to arouse)    Equipment Recommendations       Recommendations for Other Services       Precautions / Restrictions Precautions Precautions: Fall Restrictions Weight Bearing Restrictions: Yes RLE Weight Bearing: Non weight bearing      Mobility  Bed Mobility Overal bed mobility: Needs Assistance;+2 for physical assistance Bed Mobility: Rolling;Supine to Sit Rolling: Total assist;+2 for physical assistance   Supine to sit: Total assist;+2 for physical assistance;HOB elevated     General bed mobility comments: Patient very rigid with all attempts of mobility, required use of mechanical featuers of bed to sit upright, patient unable  to arouse upon upright positioning.   Transfers                    Ambulation/Gait                Stairs            Wheelchair Mobility    Modified Rankin (Stroke Patients Only)       Balance                                             Pertinent Vitals/Pain Pain Assessment: Faces Faces Pain Scale: Hurts even more Pain Location: pain response to nail bed pressure x4 extremities, pain with right hip ROM Pain Descriptors / Indicators: Grimacing Pain Intervention(s): Monitored during session;Repositioned    Home Living                        Prior Function Level of Independence: Independent         Comments: patient was a household ambulator prior to fall resulting in hip fx     Hand Dominance        Extremity/Trunk Assessment   Upper Extremity Assessment: Difficult to assess due to impaired cognition           Lower Extremity Assessment: Difficult to assess due to impaired cognition (PROM with resistance ? tone)         Communication      Cognition Arousal/Alertness: Lethargic   Overall Cognitive Status: Difficult to assess  General Comments General comments (skin integrity, edema, etc.): PROM performed all 4 extremities    Exercises        Assessment/Plan    PT Assessment Patient needs continued PT services  PT Diagnosis Other (comment)   PT Problem List Decreased strength;Decreased range of motion;Decreased activity tolerance;Decreased balance;Decreased mobility;Decreased coordination;Decreased cognition;Impaired sensation;Impaired tone  PT Treatment Interventions DME instruction;Gait training;Stair training;Functional mobility training;Therapeutic activities;Therapeutic exercise;Balance training;Neuromuscular re-education;Cognitive remediation;Patient/family education   PT Goals (Current goals can be found in the Care Plan section) Acute Rehab PT Goals Patient  Stated Goal: none stated PT Goal Formulation: Patient unable to participate in goal setting Time For Goal Achievement: 12/02/14 Potential to Achieve Goals: Fair    Frequency Min 3X/week (pending ability to arouse and participate)   Barriers to discharge        Co-evaluation PT/OT/SLP Co-Evaluation/Treatment: Yes Reason for Co-Treatment: Necessary to address cognition/behavior during functional activity           End of Session   Activity Tolerance: Patient limited by lethargy Patient left: in bed;Other (comment) (with SLP) Nurse Communication: Mobility status;Other (comment) (non-responsive,grimace to RLE ROM/withdrawl to nail bed pain)         Time: 0454-0981 PT Time Calculation (min) (ACUTE ONLY): 12 min   Charges:   PT Evaluation $Initial PT Evaluation Tier I: 1 Procedure     PT G CodesFabio Asa 12/03/2014, 9:51 AM Charlotte Crumb, PT DPT  [phone]

## 2014-11-18 NOTE — Progress Notes (Signed)
Orthopedic Tech Progress Note Patient Details:  Bruce Schultz [birth year] [id number]  Patient ID: Bruce Schultz, male   DOB: 1926-05-14, 81 y.o.   MRN: [id number] Pt unable to use trapeze bar patient helper  Nikki Dom 11/18/2014, 10:17 AM

## 2014-11-18 NOTE — Procedures (Signed)
Intubation Procedure Note Bruce Schultz [id number] 11-12-1925  Procedure: Intubation Indications: Respiratory insufficiency  Procedure Details Consent: Risks of procedure as well as the alternatives and risks of each were explained to the (patient/caregiver).  Consent for procedure obtained. and Unable to obtain consent because of altered level of consciousness. Time Out: Verified patient identification, verified procedure, site/side was marked, verified correct patient position, special equipment/implants available, medications/allergies/relevent history reviewed, required imaging and test results available.  Performed  MAC and 4 Medications:  Fentanyl  Etomidate Versed NMB    Evaluation Hemodynamic Status: BP stable throughout; O2 sats: stable throughout Patient's Current Condition: stable Complications: No apparent complications Patient did tolerate procedure well. Chest X-ray ordered to verify placement.  CXR: pending.   Bruce Schultz ACNP [facility] PCCM Pager [phone] till 3 pm If no answer page [phone] 11/18/2014, 11:26 AM  I was present, supervised and directed the entire procedure.  Bruce Schultz, M.D. [facility] Pulmonary/Critical Care Medicine. Pager: [phone]. After hours pager: [phone]

## 2014-11-18 NOTE — Progress Notes (Signed)
[facility]                                                                              Patient Demographics  Bruce Schultz, is a 81 y.o. male, DOB - 04-23-1926, ZOX:[id number]  Admit date - 11/14/2014   Admitting Physician Hillary Bow, DO  Outpatient Primary MD for the patient is Colon Branch, MD  LOS - 4   No chief complaint on file.     HPI on 12-04-14 by Dr. Della Goo Bruce Schultz is a 81 y.o. male with a history of HTN who was taken to the [facility] ED after falling at home. He reports that he has trouble walking due to chronic knee pain and he tripped and fell onto his right side. He was evaluated at the [facility] ED and X-rays revealed a Right Femoral Neck Fracture and Orthopedics Dr. Victorino Dike was consulted and arrangements were made for transfer to [facility] for admission for surgical intervention.  Assessment & Plan  Acute respiratory failure with bradycardia -Patient no longer able to maintain airway -PCCM consulted and appreciated -Patient's family unable to be reached -EKG showed questionable Afib -Will consult cardiology -Transfer patient to ICU  Acute encephalopathy/New Onset Seizure secondary to Acute CVA -Overnight patient became unresponsive with left lateral gaze -CT head: no acute intracranial abnormalities  -Neurology consulted and appreciated -Continuous EEG: Abnormal prolonged EEG monitoring due to presence of general background slowly consistent with diffuse disturbance -MRI: There is punctate foci, 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 -Continue keppra and depacon -Echocardiogram pending -LDL 80 -Hemoglobin A1c 5.8  Right femoral neck fracture secondary to fall -Hip xray: Acute transverse fracture of the right hip with severe angulation -Orthopedic surgery consulted and appreciated -S/p total hip arthroplasty, anterior approach -Continue pain control -patient will need PT and OT consult was AMS resolved  ?Acute kidney  injury vs CKD Stage 3 -Baseline Cr/GFR unknown -Cr improving, 1.4 -Continue IVF -Will continue to monitor BMP -Renal US: negative for hydronephrosis   Hypertension -lisinopril held -Continue hydralazine PRN  Normocytic Anemia -Anemia panel: iron 30, Ferritin 151 -Hb currently 9.4 -Suspect drop from dilutional component vs recent surgery -Will transfuse if Hb <7 -Continue to monitor CBC  Bradycardia -Continue telemetry monitoring -TSH 1.241  Leukocytosis -Resolved -Likely reactive to ?seizure vs surgery  Code Status: Full  Family Communication: None at bedside. Attempted to call family.  Disposition Plan: Admitted, will transfer to ICU.   Time Spent in minutes 45 minutes  Procedures  Right Total hip arthroplasty, anterior approach  Consults  Orthopedic surgery Neurology  DVT Prophylaxis SCDs  Lab Results  Component Value Date   PLT 80* 11/18/2014    Medications  Scheduled Meds: . antiseptic oral rinse  7 mL Mouth Rinse QID  . aspirin  300 mg Rectal BID PC  . chlorhexidine  15 mL Mouth Rinse BID  . docusate sodium  100 mg Oral BID  . [START ON 11/19/2014] escitalopram  10 mg Per Tube Daily  . famotidine (PEPCID) IV  20 mg Intravenous Q12H  . fentaNYL      . levETIRAcetam  500 mg Intravenous Q12H  . midazolam      . pantoprazole (  PROTONIX) IV  40 mg Intravenous Q24H  . senna  1 tablet Oral BID  . valproate sodium  500 mg Intravenous Q12H   Continuous Infusions: . sodium chloride 75 mL/hr at 11/18/14 1200   PRN Meds:.acetaminophen **OR** acetaminophen, fentaNYL (SUBLIMAZE) injection, fentaNYL (SUBLIMAZE) injection, hydrALAZINE, ondansetron **OR** ondansetron (ZOFRAN) IV, [DISCONTINUED] menthol-cetylpyridinium **OR** phenol  Antibiotics    Anti-infectives    Start     Dose/Rate Route Frequency Ordered Stop   11/16/14 0600  ceFAZolin (ANCEF) IVPB 2 g/50 mL premix     2 g 100 mL/hr over 30 Minutes Intravenous On call to O.R. 11/07/2014 1327  11/16/14 0715   11/16/14 0000  ceFAZolin (ANCEF) IVPB 2 g/50 mL premix     2 g 100 mL/hr over 30 Minutes Intravenous Every 6 hours 11/06/2014 2232 11/16/14 0718   11/01/2014 1345  ceFAZolin (ANCEF) IVPB 2 g/50 mL premix  Status:  Discontinued     2 g 100 mL/hr over 30 Minutes Intravenous To Surgery 11/06/2014 0804 11/10/2014 2232     Subjective:   [name] seen and examined today. Patient currently nonverbal and having continuous EEG.     Objective:   Filed Vitals:   11/18/14 0917 11/18/14 1100 11/18/14 1133 11/18/14 1200  BP: 132/41 145/53 137/46 167/54  Pulse: 42 47 49 39  Temp: 97.7 F (36.5 C)     TempSrc: Axillary     Resp: 20 12 15 9   Height:   5\' 11"  (1.803 m)   SpO2: 100% 100% 100% 100%    Wt Readings from Last 3 Encounters:  No data found for Wt     Intake/Output Summary (Last 24 hours) at 11/18/14 1343 Last data filed at 11/18/14 1200  Gross per 24 hour  Intake   1015 ml  Output    600 ml  Net    415 ml    Exam  General: Well developed, well nourished, no distress  HEENT: NCAT,  mucous membranes moist  Cardiovascular: S1 S2 auscultated, bradycardic  Respiratory: Scattered rhonchi   Abdomen: Soft, nontender, nondistended, + bowel sounds  Extremities: warm dry without cyanosis clubbing or edema  Neuro: Unable to assess, jerking of lower ext  Data Review   Micro Results Recent Results (from the past 240 hour(s))  Surgical pcr screen     Status: Abnormal   Collection Time: 11/20/2014  1:32 PM  Result Value Ref Range Status   MRSA, PCR NEGATIVE NEGATIVE Final   Staphylococcus aureus POSITIVE (A) NEGATIVE Final    Comment:        The Xpert SA Assay (FDA approved for NASAL specimens in patients over 32 years of age), is one component of a comprehensive surveillance program.  Test performance has been validated by [facility] for patients greater than or equal to 79 year old. It is not intended to diagnose infection nor to guide or monitor  treatment.     Radiology Reports Ct Head Wo Contrast  11/16/2014   CLINICAL DATA:  Seizures.  EXAM: CT HEAD WITHOUT CONTRAST  TECHNIQUE: Contiguous axial images were obtained from the base of the skull through the vertex without intravenous contrast.  COMPARISON:  11/14/2014  FINDINGS: Examination is technically limited due to motion artifact. Mild diffuse cerebral atrophy. Low-attenuation changes diffusely throughout the white matter consistent with small vessel ischemia. 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 No mass effect or midline shift. No abnormal extra-axial fluid collections. Gray-white matter junctions are distinct. Basal cisterns  are not effaced. No evidence of acute intracranial hemorrhage. No depressed skull fractures. Visualized paranasal sinuses and mastoid air cells are not opacified.  IMPRESSION: No acute intracranial abnormalities. Chronic atrophy and small vessel ischemic changes. 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 No change since prior study.   Electronically Signed   By: Burman Nieves M.D.   On: 11/16/2014 06:00   Mr Brain Wo Contrast  11/17/2014   CLINICAL DATA:  81 year old male with acute onset altered mental status, suspected new onset seizure of unclear etiology. Unresponsive. Initial encounter.  EXAM: MRI HEAD WITHOUT CONTRAST  TECHNIQUE: Multiplanar, multiecho pulse sequences of the brain and surrounding structures were obtained without intravenous contrast.  COMPARISON:  Head CTs without contrast 10/1914 and 11/14/2014  FINDINGS: There are numerous small foci of restricted diffusion scattered in both cerebral hemispheres, primarily the MCA territories along the superior aspect of both hemispheres. There is some bilateral deep gray matter nuclei involvement, primarily both thalami. There is PCA territory involvement bilaterally. The brainstem is relatively spared. There is occasional  right cerebellar hemisphere involvement.  No associated acute hemorrhage or mass effect.  There is a 3-4 cm area of chronic hemorrhage in encephalomalacia in the medial right inferior frontal lobe. There is Patchy and confluent bilateral cerebral white matter T2 and FLAIR hyperintensity. Smaller focus of chronic hemorrhage in the posterior right operculum. Occasional chronic micro hemorrhages elsewhere. T2 heterogeneity in the deep gray matter nuclei mostly resembles perivascular spaces. Occasional chronic lacunar infarcts in the cerebellum. Mild for age patchy T2 and FLAIR hyperintensity in the brainstem.  Major intracranial vascular flow voids are preserved, dominant appearing distal right vertebral artery  No definite signal abnormality in the hippocampal formations or mesial temporal lobes. No intracranial mass effect. No ventriculomegaly. Negative pituitary. Cervicomedullary junction remarkable for degenerative ligamentous hypertrophy about the odontoid. Normal bone marrow signal.  Visible internal auditory structures appear normal. Mastoids are clear. Paranasal sinuses are clear. Negative orbit and scalp soft tissues.  IMPRESSION: 1. Numerous punctate foci of restricted diffusion throughout the brain, with superior MCA territory involvement predominating. Appearance is most suggestive of widespread lacunar infarcts as the sequelae of cardiac or proximal aortic emboli. 2. No associated mass effect or acute hemorrhage. 3. Areas of chronic hemorrhage and encephalomalacia in the right hemisphere and moderate for age signal changes suggesting chronic small vessel disease.   Electronically Signed   By: Odessa Fleming M.D.   On: 11/17/2014 13:06   US Renal  11/17/2014   CLINICAL DATA:  Acute kidney injury.  EXAM: RENAL / URINARY TRACT ULTRASOUND COMPLETE  COMPARISON:  None.  FINDINGS: Right Kidney:  Length: 8.3 cm. Right kidney is small. Negative for hydronephrosis. Echogenicity of the right kidney is within normal  limits.  Left Kidney:  Length: 9.1 cm. Negative for left hydronephrosis. Echogenicity in the left kidney is within normal limits. Left kidney lower pole is not well visualized.  Bladder:  Urinary bladder is decompressed with a Foley catheter.  IMPRESSION: Negative for hydronephrosis.   Electronically Signed   By: Richarda Overlie M.D.   On: 11/17/2014 15:33   Pelvis Portable  2014/12/04   CLINICAL DATA:  81 year old male status post right hip surgery for proximal femur fracture. Initial encounter.  EXAM: PORTABLE PELVIS 1-2 VIEWS  COMPARISON:  11/14/2014.  FINDINGS: Portable supine AP views of the pelvis 2311 hrs. Right proximal femoral arthroplasty now in place with cerclage wires. Hardware appears intact. Alignment with the acetabulum appears normal on  this view. Surrounding postoperative changes to the soft tissues with subcutaneous gas. No unexpected osseous changes or new osseous abnormality identified.  IMPRESSION: Right hip arthroplasty with no adverse features.   Electronically Signed   By: Odessa Fleming M.D.   On: 2014-12-04 23:35   Portable Chest Xray  11/18/2014   CLINICAL DATA:  Hypoxia  EXAM: PORTABLE CHEST - 1 VIEW  COMPARISON:  November 14, 2014  FINDINGS: Endotracheal tube tip is 2.9 cm above the carina. Central catheter tip is in the superior vena cava. There is no pneumothorax. There is no edema or consolidation. The heart size and pulmonary vascularity are normal. No adenopathy. No bone lesions.  IMPRESSION: Tube and catheter positions as described without pneumothorax. No edema or consolidation.   Electronically Signed   By: Bretta Bang III M.D.   On: 11/18/2014 12:50   Dg Hip Unilat With Pelvis 1v Right  December 04, 2014   CLINICAL DATA:  Right hip pain after a fall today.  EXAM: RIGHT HIP (WITH PELVIS) 1 VIEW  COMPARISON:  None.  FINDINGS: Transverse fracture through the right femoral neck with varus angulation of the fracture fragments. No dislocation of the hip joint. Degenerative changes in the  lower lumbar spine and both hips. Pelvis appears intact. SI joints and symphysis pubis are not displaced.  IMPRESSION: Acute transverse fracture of the right hip with varus angulation.   Electronically Signed   By: Burman Nieves M.D.   On: December 04, 2014 00:04   Dg Hip Operative Unilat With Pelvis Right  12/04/14   CLINICAL DATA:  Right hip replacement.  EXAM: OPERATIVE RIGHT HIP (WITH PELVIS IF PERFORMED) 3 VIEWS  TECHNIQUE: Fluoroscopic spot image(s) were submitted for interpretation post-operatively.  FLUOROSCOPY TIME:  Radiation Exposure Index (as provided by the fluoroscopic device): Not applicable  If the device does not provide the exposure index:  Fluoroscopy Time:  0 min 41 seconds  Number of Acquired Images:  3  COMPARISON:  Yesterday.  FINDINGS: Interval right hip prosthesis in satisfactory position and alignment on frontal views. No fracture or dislocation seen on these views.  IMPRESSION: Satisfactory postoperative appearance of a right hip prosthesis in the frontal projection.   Electronically Signed   By: Beckie Salts M.D.   On: 12-04-14 21:41   Dg Femur, Min 2 Views Right  12/04/2014   CLINICAL DATA:  81 year old male who fell today with right hip pain. Initial encounter.  EXAM: RIGHT FEMUR 2 VIEWS  COMPARISON:  [facility] right hip series 1815 hr 11/14/2014.  FINDINGS: Comminuted right femoral neck fracture with varus impaction re- demonstrated. Configuration is stable since 1815 hrs.  The more distal right femur appears intact. Severe tricompartmental degenerative changes at the right knee. Calcified atherosclerosis in the right lower extremity.  IMPRESSION: Mildly comminuted right femoral neck fracture with varus impaction is stable from earlier today.  No other right femur fracture identified.   Electronically Signed   By: Odessa Fleming M.D.   On: 2014/12/04 00:07    CBC  Recent Labs Lab 12/04/2014 0501 04-Dec-2014 1634 11/16/14 0520 11/17/14 0510 11/18/14 0602  WBC 6.8   --  12.0* 9.2 7.9  HGB 12.3* 14.3 12.8* 9.9* 9.4*  HCT 37.3* 42.0 38.5* 29.0* 28.1*  PLT 136*  --  128* 101* 80*  MCV 88.2  --  89.1 87.9 88.4  MCH 29.1  --  29.6 30.9 29.6  MCHC 33.0  --  33.2 35.2 33.5  RDW 14.5  --  14.3 14.9 14.9  LYMPHSABS 1.1  --   --   --   --   MONOABS 0.7  --   --   --   --   EOSABS 0.1  --   --   --   --   BASOSABS 0.0  --   --   --   --     Chemistries   Recent Labs Lab 11/27/2014 1634 11/16/14 0520 11/17/14 0510 11/18/14 0602  NA 139 139 135 141  K 5.8* 4.3 4.6 4.1  CL 103 109 107 112*  CO2  --  11* 23 24  GLUCOSE 86 208* 104* 80  BUN 28* 24* 41* 36*  CREATININE 1.20 1.65* 1.93* 1.41*  CALCIUM  --  8.8* 7.6* 7.8*  MG  --  1.9  --   --   AST  --  41  --   --   ALT  --  18  --   --   ALKPHOS  --  36*  --   --   BILITOT  --  1.0  --   --    ------------------------------------------------------------------------------------------------------------------ CrCl cannot be calculated (Unknown ideal weight.). ------------------------------------------------------------------------------------------------------------------  Recent Labs  11/17/14 1620  HGBA1C 5.8*   ------------------------------------------------------------------------------------------------------------------  Recent Labs  11/17/14 1620  CHOL 132  HDL 34*  LDLCALC 80  TRIG 92  CHOLHDL 3.9   ------------------------------------------------------------------------------------------------------------------  Recent Labs  11/17/14 0510  TSH 1.241   ------------------------------------------------------------------------------------------------------------------ No results for input(s): VITAMINB12, FOLATE, FERRITIN, TIBC, IRON, RETICCTPCT in the last 72 hours.  Coagulation profile  Recent Labs Lab 11/05/2014 0501  INR 1.26    No results for input(s): DDIMER in the last 72 hours.  Cardiac Enzymes No results for input(s): CKMB, TROPONINI, MYOGLOBIN in the last 168  hours.  Invalid input(s): CK ------------------------------------------------------------------------------------------------------------------ Invalid input(s): POCBNP    Cornelious Bartolucci D.O. on 11/18/2014 at 1:43 PM  Between 7am to 7pm - Pager - [phone]  After 7pm go to [email or web address] - password [email or web address]  And look for the night coverage person covering for me after hours  [facility] Office  [phone]

## 2014-11-18 NOTE — Progress Notes (Signed)
Pt transferred at this time to 3M07 unresponsive, HR low 30's-40's range during assessment.  O2 sat 100% on 2L via n/c.  Rapid and AC notified by charge nurse.  Spoke with family who called shortly after and notified of transfer. Text paged Md to give correct number to contact family. Report called in to nurse on unit.

## 2014-11-18 NOTE — Progress Notes (Signed)
Rehab Admissions Coordinator Note:  Patient was screened by Bruce Schultz for appropriateness for an Inpatient Acute Rehab Consult per PT recommendation due to medical complexity. At this time, we are recommending await pt's ability to participate in therapy. I will follow up.Bruce Schultz 11/18/2014, 10:31 AM  I can be reached at [phone].

## 2014-11-18 NOTE — Progress Notes (Signed)
OT Cancellation Note  Patient Details Name: Bruce Schultz MRN: [id number] DOB: 07-07-1925   Cancelled Treatment:    Reason Eval/Treat Not Completed: Patient's level of consciousness;Medical issues which prohibited therapy  Galen Manila 11/18/2014, 9:58 AM

## 2014-11-19 ENCOUNTER — Inpatient Hospital Stay (HOSPITAL_COMMUNITY): Payer: Medicare Other

## 2014-11-19 ENCOUNTER — Encounter (HOSPITAL_COMMUNITY): Payer: Medicare Other

## 2014-11-19 DIAGNOSIS — N179 Acute kidney failure, unspecified: Secondary | ICD-10-CM | POA: Insufficient documentation

## 2014-11-19 LAB — BASIC METABOLIC PANEL
Anion gap: 5 (ref 5–15)
BUN: 34 mg/dL — ABNORMAL HIGH (ref 6–20)
CALCIUM: 7.9 mg/dL — AB (ref 8.9–10.3)
CO2: 24 mmol/L (ref 22–32)
Chloride: 115 mmol/L — ABNORMAL HIGH (ref 101–111)
Creatinine, Ser: 1.1 mg/dL (ref 0.61–1.24)
GFR calc Af Amer: >60 mL/min (ref >60)
GFR, EST NON AFRICAN AMERICAN: 58 mL/min — AB (ref >60)
Glucose, Bld: 94 mg/dL (ref 65–99)
POTASSIUM: 3.5 mmol/L (ref 3.5–5.1)
SODIUM: 144 mmol/L (ref 135–145)

## 2014-11-19 LAB — BLOOD GAS, ARTERIAL
Acid-base deficit: 2.2 mmol/L — ABNORMAL HIGH (ref 0.0–2.0)
BICARBONATE: 21.2 meq/L (ref 20.0–24.0)
Drawn by: 331761
FIO2: 0.3 %
MECHVT: 600 mL
O2 Saturation: 98.8 %
PATIENT TEMPERATURE: 98.6
PCO2 ART: 30.6 mmHg — AB (ref 35.0–45.0)
PEEP: 5 cmH2O
PH ART: 7.454 — AB (ref 7.350–7.450)
RATE: 12 resp/min
TCO2: 22.1 mmol/L (ref 0–100)
pO2, Arterial: 142 mmHg — ABNORMAL HIGH (ref 80.0–100.0)

## 2014-11-19 LAB — MAGNESIUM: Magnesium: 2 mg/dL (ref 1.7–2.4)

## 2014-11-19 LAB — GLUCOSE, CAPILLARY
GLUCOSE-CAPILLARY: 41 mg/dL — AB (ref 65–99)
GLUCOSE-CAPILLARY: 75 mg/dL (ref 65–99)
GLUCOSE-CAPILLARY: 64 mg/dL — AB (ref 65–99)
GLUCOSE-CAPILLARY: 81 mg/dL (ref 65–99)
Glucose-Capillary: 81 mg/dL (ref 65–99)
Glucose-Capillary: 66 mg/dL (ref 65–99)
Glucose-Capillary: 78 mg/dL (ref 65–99)
Glucose-Capillary: 61 mg/dL — ABNORMAL LOW (ref 65–99)
Glucose-Capillary: 80 mg/dL (ref 65–99)

## 2014-11-19 LAB — CBC
HCT: 25 % — ABNORMAL LOW (ref 39.0–52.0)
Hemoglobin: 8.6 g/dL — ABNORMAL LOW (ref 13.0–17.0)
MCH: 29.9 pg (ref 26.0–34.0)
MCHC: 34.4 g/dL (ref 30.0–36.0)
MCV: 86.8 fL (ref 78.0–100.0)
PLATELETS: 75 10*3/uL — AB (ref 150–400)
RBC: 2.88 MIL/uL — AB (ref 4.22–5.81)
RDW: 15 % (ref 11.5–15.5)
WBC: 5.9 10*3/uL (ref 4.0–10.5)

## 2014-11-19 LAB — PHOSPHORUS: Phosphorus: 2.1 mg/dL — ABNORMAL LOW (ref 2.5–4.6)

## 2014-11-19 MED ORDER — ASPIRIN 300 MG RE SUPP
300.0000 mg | Freq: Every day | RECTAL | Status: DC
  Administered 2014-11-20: 300 mg via RECTAL
  Filled 2014-11-19 (×3): qty 1

## 2014-11-19 MED ORDER — POTASSIUM CHLORIDE 20 MEQ/15ML (10%) PO SOLN
40.0000 meq | Freq: Three times a day (TID) | ORAL | Status: AC
  Administered 2014-11-19 (×2): 40 meq
  Filled 2014-11-19 (×2): qty 30

## 2014-11-19 MED ORDER — VITAL HIGH PROTEIN PO LIQD: 1000.0000 mL | ORAL | Status: DC

## 2014-11-19 MED ORDER — DEXTROSE 50 % IV SOLN
25.0000 mL | Freq: Once | INTRAVENOUS | Status: AC
  Administered 2014-11-19: 25 mL via INTRAVENOUS

## 2014-11-19 MED ORDER — PRO-STAT 64 PO LIQD
30.0000 mL | Freq: Every day | ORAL | Status: DC
  Administered 2014-11-19 – 2014-11-20 (×2): 30 mL
  Filled 2014-11-19 (×4): qty 30

## 2014-11-19 MED ORDER — DEXTROSE 50 % IV SOLN
INTRAVENOUS | Status: AC
  Filled 2014-11-19: qty 50

## 2014-11-19 MED ORDER — VITAL AF 1.2 CAL PO LIQD
1000.0000 mL | ORAL | Status: DC
  Administered 2014-11-19 – 2014-11-20 (×2): 1000 mL
  Filled 2014-11-19 (×7): qty 1000

## 2014-11-19 NOTE — Progress Notes (Addendum)
Initial Nutrition Assessment  DOCUMENTATION CODES:  Severe malnutrition in context of chronic illness  INTERVENTION:  Initiate Vital AF 1.2 @ 20 ml/hr via feeding tube and increase by 10 ml every 4 hours to goal rate of 50 ml/hr.   30 ml Prostat daily.    Tube feeding regimen provides 1540 kcal, 105 grams of protein, and 973 ml of H2O.    NUTRITION DIAGNOSIS:  Malnutrition related to chronic illness as evidenced by severe depletion of body fat, severe depletion of muscle mass.   GOAL:  Patient will meet greater than or equal to 90% of their needs   MONITOR:  Vent status, Labs, I & O's  REASON FOR ASSESSMENT:  Ventilator    ASSESSMENT:  Admitted 6/18 with repair of right hip fx, next day found unresponsive with clinical evidence of sz. 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  Per RN may transition to comfort today.   Patient is currently intubated on ventilator support MV: 7.6 L/min Temp (24hrs), Avg:97.7 F (36.5 C), Min:97.1 F (36.2 C), Max:98.4 F (36.9 C)  Nutrition-Focused physical exam completed. Findings are severe fat depletion, severe muscle depletion, and no edema.  Labs and medications reviewed.  Pt has not had a bm since admission but is on colace and senokot.    Height:  Ht Readings from Last 1 Encounters:  11/18/14 5\' 11"  (1.803 m)    Weight:  Wt Readings from Last 1 Encounters:  11/19/14 153 lb 11.2 oz (69.718 kg)    Ideal Body Weight:  78.1 kg  Wt Readings from Last 10 Encounters:  11/19/14 153 lb 11.2 oz (69.718 kg)    BMI:  Body mass index is 21.45 kg/(m^2).  Estimated Nutritional Needs:  Kcal:  1530  Protein:  100-115 grams  Fluid:  > 1.5 L/day  Skin:  Reviewed, no issues  Diet Order:  Diet NPO time specified  EDUCATION NEEDS:  No education needs identified at this time   Intake/Output Summary (Last 24 hours) at 11/19/14 0745 Last data filed at 11/19/14 0700  Gross per 24 hour   Intake   2295 ml  Output    985 ml  Net   1310 ml    Last BM:  PTA  Kendell Bane RD, LDN, CNSC [phone] Pager [phone] After Hours Pager

## 2014-11-19 NOTE — Progress Notes (Signed)
Hypoglycemic Event  CBG: 66  Treatment: D50 IV 25 mL  Symptoms: None  Follow-up CBG: Time:0357 CBG Result:78  Possible Reasons for Event:  NPO Comments/MD notified: will pass along to day shift. Pt may be started on tube feeds today if not transitioned to comfort care. Will continue to monitor.    Bruce Schultz, [place]  Remember to initiate Hypoglycemia Order Set & complete

## 2014-11-19 NOTE — Progress Notes (Signed)
Bedside EEG completed, results pending. 

## 2014-11-19 NOTE — Progress Notes (Signed)
OT Cancellation Note  Patient Details Name: Bruce Schultz MRN: [id number] DOB: 08/13/1925   Cancelled Treatment:    Reason Eval/Treat Not Completed: Medical issues which prohibited therapy - note pt with likely transition to comfort care.  Will hold OT at this time and will monitor for family's decision to transition to comfort.   Angelene Giovanni [place], OTR/L [phone]  11/19/2014, 10:23 AM

## 2014-11-19 NOTE — Progress Notes (Signed)
Hypoglycemic Event  CBG: 41  Treatment: D50 IV 25 mL  Symptoms: None  Follow-up CBG: Time: 0148 CBG Result:81  Possible Reasons for Event: Inadequate meal intake- NPO  Comments/MD notified:    Marae Cottrell, [place] Marie  Remember to initiate Hypoglycemia Order Set & complete

## 2014-11-19 NOTE — Progress Notes (Signed)
STROKE TEAM PROGRESS NOTE   HISTORY Bruce Schultz is an 81 y.o. male hx of HTN, HLD, depression admitted on 6/18 after suffering a fall at home with resultant R femoral neck fracture. Had successful surgical repair on 6/18 with no complications. Post procedure was doing well. LSW 0130 on 6/19. Around 0430 noted to be unresponsive with left gaze deviation, would withdrawal symmetrically to noxious stimuli. Shortly after RN noted facial twitching which progressed to involve his head turned to the left with right arm extended and repetitive flexion of LUE. Episode lasted around 30 seconds to 1 minute. Eyes midline after episode but he remains unresponsive. He received ativan  x 1 and loaded with keppra  x 1. Seen by neurology. Following day seizure activity has slowed the patient remained unresponsive. Continuous EEG monitoring started an MRI ordered. MRI showed small bilateral cortical and subcortical infarcts. Patient was transferred to the stroke service for follow-up.   SUBJECTIVE (INTERVAL HISTORY) Patient has remained unresponsive despite not being on sedation. Patient's wife and daughter are at the bedside and state at baseline he has mild dementia and needs only slight supervision with his activities of daily living. CT angiogram of the brain and neck show no significant large vessel stenosis or occlusion OBJECTIVE Temp:  [97.1 F (36.2 C)-98.1 F (36.7 C)] 98.1 F (36.7 C) (06/22 1140) Pulse Rate:  [36-81] 49 (06/22 1100) Cardiac Rhythm:  [-] Other (Comment) (06/22 0800) Resp:  [8-23] 12 (06/22 1100) BP: (103-181)/(44-75) 137/53 mmHg (06/22 1100) SpO2:  [100 %] 100 % (06/22 1100) FiO2 (%):  [30 %] 30 % (06/22 1142) Weight:  [153 lb 11.2 oz (69.718 kg)] 153 lb 11.2 oz (69.718 kg) (06/22 0733)   Recent Labs Lab 11/19/14 0148 11/19/14 0337 11/19/14 0357 11/19/14 0817 11/19/14 1139  GLUCAP 81 66 78 75 64*    Recent Labs Lab 26-Nov-2014 1634  11/16/14 0520 11/17/14 0510  11/18/14 0602 11/19/14 0600  NA 139  --  139 135 141 144  K 5.8*  --  4.3 4.6 4.1 3.5  CL 103  --  109 107 112* 115*  CO2  --   --  11* GLUCOSE 86  --  208* 104* 80 94  BUN 28*  --  24* 41* 36* 34*  CREATININE 1.20  --  1.65* 1.93* 1.41* 1.10  CALCIUM  --   < > 8.8* 7.6* 7.8* 7.9*  MG  --   --  1.9  --   --  2.0  PHOS  --   --   --   --   --  2.1*  < > = values in this interval not displayed.  Recent Labs Lab 11/16/14 0520  AST 41  ALT 18  ALKPHOS 36*  BILITOT 1.0  PROT 5.5*  ALBUMIN 2.5*    Recent Labs Lab 11-26-2014 0501 2014-11-26 1634 11/16/14 0520 11/17/14 0510 11/18/14 0602 11/19/14 0600  WBC 6.8  --  12.0* 9.2 7.9 5.9  NEUTROABS 5.0  --   --   --   --   --   HGB 12.3* 14.3 12.8* 9.9* 9.4* 8.6*  HCT 37.3* 42.0 38.5* 29.0* 28.1* 25.0*  MCV 88.2  --  89.1 87.9 88.4 86.8  PLT 136*  --  128* 101* 80* 75*   No results for input(s): CKTOTAL, CKMB, CKMBINDEX, TROPONINI in the last 168 hours. No results for input(s): LABPROT, INR in the last 72 hours. No results for input(s): COLORURINE, LABSPEC, PHURINE, GLUCOSEU,  HGBUR, BILIRUBINUR, KETONESUR, PROTEINUR, UROBILINOGEN, NITRITE, LEUKOCYTESUR in the last 72 hours.  Invalid input(s): APPERANCEUR     Component Value Date/Time   CHOL 132 11/17/2014 1620   TRIG 92 11/17/2014 1620   HDL 34* 11/17/2014 1620   CHOLHDL 3.9 11/17/2014 1620   VLDL 18 11/17/2014 1620   LDLCALC 80 11/17/2014 1620   Lab Results  Component Value Date   HGBA1C 5.8* 11/17/2014   No results found for: LABOPIA, COCAINSCRNUR, LABBENZ, AMPHETMU, THCU, LABBARB  No results for input(s): ETH in the last 168 hours.  Ct Angio Head W/cm &/or Wo Cm  11/18/2014   CLINICAL DATA:  Evaluate multiple BILATERAL acute infarcts. Recent surgical repair of femoral neck fracture 11/12/2014.  EXAM: CT ANGIOGRAPHY HEAD AND NECK  TECHNIQUE: Multidetector CT imaging of the head and neck was performed using the standard protocol during bolus administration of  intravenous contrast. Multiplanar CT image reconstructions and MIPs were obtained to evaluate the vascular anatomy. Carotid stenosis measurements (when applicable) are obtained utilizing NASCET criteria, using the distal internal carotid diameter as the denominator.  CONTRAST:  60mL OMNIPAQUE IOHEXOL 350 MG/ML SOLN  COMPARISON:  MRI brain 11/17/2014.  FINDINGS: The patient was unable to remain motionless for the exam. Small or subtle lesions could be overlooked.  CT HEAD  Calvarium and skull base: No fracture or destructive lesion. Mastoids and middle ears are grossly clear.  Paranasal sinuses: Imaged portions are clear.  Orbits: Negative.  Brain: No visible acute infarct; the areas of acute infarction are much better visualized on MR. No hemorrhage, hydrocephalus, mass lesion, or extra-axial fluid.  Remote RIGHT frontal infarction is stable. Advanced chronic microvascular ischemic change throughout the subcortical and periventricular white matter.  CTA NECK  Aortic arch: Standard branching. Imaged portion shows no evidence of aneurysm or dissection. Major arch vessel origins incompletely visualized  Right carotid system: Mild calcific plaque. No evidence of dissection, stenosis (50% or greater) or occlusion.  Left carotid system: Mild calcific plaque. No evidence of dissection, stenosis (50% or greater) or occlusion.  Vertebral arteries: RIGHT vertebral dominant. LEFT vertebral severely diseased throughout its course. No evidence of dissection, stenosis (50% or greater) or occlusion.  Nonvascular structures: Cervical spondylosis. Ossification posterior longitudinal ligament. Patient is intubated. Mild fluid in the LEFT major fissure. No pneumothorax. No lung nodule. No neck masses.  CTA HEAD  Anterior circulation: Mild non stenotic cavernous and supraclinoid plaque. No significant stenosis, proximal occlusion, aneurysm, or vascular malformation.  Posterior circulation: RIGHT vertebral is the dominant/sole  contributor to the basilar; LEFT vertebral supplies mainly the inferior cerebellum. No significant stenosis, proximal occlusion, aneurysm, or vascular malformation.  Venous sinuses: As permitted by contrast timing, patent.  Anatomic variants: BILATERAL fetal PCA origins contribute to slight basilar hypoplasia  Delayed phase:   No abnormal intracranial enhancement.  IMPRESSION: No significant extracranial or intracranial flow reducing lesion is evident. The observed pattern of cerebral ischemia on MR is likely due to cardiac emboli, or potentially watershed phenomenon.   Electronically Signed   By: Elsie Stain M.D.   On: 11/18/2014 19:00   Ct Angio Neck W/cm &/or Wo/cm  11/18/2014   CLINICAL DATA:  Evaluate multiple BILATERAL acute infarcts. Recent surgical repair of femoral neck fracture 11/24/2014.  EXAM: CT ANGIOGRAPHY HEAD AND NECK  TECHNIQUE: Multidetector CT imaging of the head and neck was performed using the standard protocol during bolus administration of intravenous contrast. Multiplanar CT image reconstructions and MIPs were obtained to evaluate the vascular anatomy. Carotid stenosis measurements (  when applicable) are obtained utilizing NASCET criteria, using the distal internal carotid diameter as the denominator.  CONTRAST:  60mL OMNIPAQUE IOHEXOL 350 MG/ML SOLN  COMPARISON:  MRI brain 11/17/2014.  FINDINGS: The patient was unable to remain motionless for the exam. Small or subtle lesions could be overlooked.  CT HEAD  Calvarium and skull base: No fracture or destructive lesion. Mastoids and middle ears are grossly clear.  Paranasal sinuses: Imaged portions are clear.  Orbits: Negative.  Brain: No visible acute infarct; the areas of acute infarction are much better visualized on MR. No hemorrhage, hydrocephalus, mass lesion, or extra-axial fluid.  Remote RIGHT frontal infarction is stable. Advanced chronic microvascular ischemic change throughout the subcortical and periventricular white matter.   CTA NECK  Aortic arch: Standard branching. Imaged portion shows no evidence of aneurysm or dissection. Major arch vessel origins incompletely visualized  Right carotid system: Mild calcific plaque. No evidence of dissection, stenosis (50% or greater) or occlusion.  Left carotid system: Mild calcific plaque. No evidence of dissection, stenosis (50% or greater) or occlusion.  Vertebral arteries: RIGHT vertebral dominant. LEFT vertebral severely diseased throughout its course. No evidence of dissection, stenosis (50% or greater) or occlusion.  Nonvascular structures: Cervical spondylosis. Ossification posterior longitudinal ligament. Patient is intubated. Mild fluid in the LEFT major fissure. No pneumothorax. No lung nodule. No neck masses.  CTA HEAD  Anterior circulation: Mild non stenotic cavernous and supraclinoid plaque. No significant stenosis, proximal occlusion, aneurysm, or vascular malformation.  Posterior circulation: RIGHT vertebral is the dominant/sole contributor to the basilar; LEFT vertebral supplies mainly the inferior cerebellum. No significant stenosis, proximal occlusion, aneurysm, or vascular malformation.  Venous sinuses: As permitted by contrast timing, patent.  Anatomic variants: BILATERAL fetal PCA origins contribute to slight basilar hypoplasia  Delayed phase:   No abnormal intracranial enhancement.  IMPRESSION: No significant extracranial or intracranial flow reducing lesion is evident. The observed pattern of cerebral ischemia on MR is likely due to cardiac emboli, or potentially watershed phenomenon.   Electronically Signed   By: Elsie Stain M.D.   On: 11/18/2014 19:00   US Renal  11/17/2014   CLINICAL DATA:  Acute kidney injury.  EXAM: RENAL / URINARY TRACT ULTRASOUND COMPLETE  COMPARISON:  None.  FINDINGS: Right Kidney:  Length: 8.3 cm. Right kidney is small. Negative for hydronephrosis. Echogenicity of the right kidney is within normal limits.  Left Kidney:  Length: 9.1 cm. Negative  for left hydronephrosis. Echogenicity in the left kidney is within normal limits. Left kidney lower pole is not well visualized.  Bladder:  Urinary bladder is decompressed with a Foley catheter.  IMPRESSION: Negative for hydronephrosis.   Electronically Signed   By: Richarda Overlie M.D.   On: 11/17/2014 15:33   Dg Chest Port 1 View  11/19/2014   CLINICAL DATA:  Intubation, RIGHT hip fracture, respiratory failure, bradycardia, history hypertension, hyperlipidemia  EXAM: PORTABLE CHEST - 1 VIEW  COMPARISON:  Portable exam 0617 hours compared 07/06/2019 2016  FINDINGS: Tip of endotracheal tube projects 1.8 cm above carina.  LEFT jugular central venous catheter tip projects over SVC.  Normal heart size, mediastinal contours, and pulmonary vascularity.  Lungs clear.  No pleural effusion or pneumothorax.  Bones demineralized.  IMPRESSION: Satisfactory endotracheal tube position.  No acute abnormalities.   Electronically Signed   By: Ulyses Southward M.D.   On: 11/19/2014 08:07   Portable Chest Xray  11/18/2014   CLINICAL DATA:  Hypoxia  EXAM: PORTABLE CHEST - 1 VIEW  COMPARISON:  November 14, 2014  FINDINGS: Endotracheal tube tip is 2.9 cm above the carina. Central catheter tip is in the superior vena cava. There is no pneumothorax. There is no edema or consolidation. The heart size and pulmonary vascularity are normal. No adenopathy. No bone lesions.  IMPRESSION: Tube and catheter positions as described without pneumothorax. No edema or consolidation.   Electronically Signed   By: Bretta Bang III M.D.   On: 11/18/2014 12:50   Dg Abd Portable 1v  11/19/2014   CLINICAL DATA:  Stroke.  OG tube placement  EXAM: PORTABLE ABDOMEN - 1 VIEW  COMPARISON:  None.  FINDINGS: Enteric tube tip is in the mid stomach. Mild gaseous distention of visualize colon. Visualized lung fields are clear.  IMPRESSION: Enteric tube tip in the mid stomach.   Electronically Signed   By: Charlett Nose M.D.   On: 11/19/2014 12:31   2-D  echocardiogram - Procedure narrative: Transthoracic echocardiography. Imagequality was suboptimal. The study was technically difficult, as aresult of poor acoustic windows, poor sound wave transmission,chest wall deformity, and body habitus. - Left ventricle: Can not comment on function based on currentimages.   PHYSICAL EXAM Frail elderly male sedated and intubated. . Afebrile. Head is nontraumatic. Neck is supple without bruit.    Cardiac exam no murmur or gallop. Lungs are clear to auscultation. Distal pulses are well felt. Neurological Exam   patient  ios intubated. Eyes are closed. Unresponsive pupils small irregular sluggishly reactive. Left eye corneal opacity. Fundi could not be visualized. Tongue midline. Doll`s eye movements are sluggish. Weak corneals , cough and gag. Minimal motor response to noxious stimuli in all 4 limbs legss. legs more than arm. ASSESSMENT/PLAN Mr. Bruce Schultz is a 81 y.o. male with history of chronic knee pain who tripped and fell presenting with right femoral neck fracture status post right total hip arthroplasty 6/18 he developed unresponsiveness and left gaze deviation followed by left upper body seizure. MRI showed bilateral small infarcts.  He did not receive IV t-PA due to nonfocal presentation.  Small bilateral infarts, felt to be cardioembolic versus embolic from fat emboli post hip fracture/fall  MRI  Small bilateral infarcts. small vessel disease   CTA head and neck ordered  2D Echo  Study of poor quality. LV not seen well  Possible TEE to assess for embolic source if PAF not verified  LDL 80  HgbA1c 5.8  SCDs for VTE prophylaxis Diet NPO time specified  aspirin 81 mg orally every day prior to admission, now on aspirin 300 mg suppository daily  Ongoing aggressive stroke risk factor management  Therapy recommendations:  pending   Disposition:  pending   Seizures  On Depacon 500 mg to 12 and Keppra 500 every 12  EEG short  activity versus epileptiform activity  Valproic acid level 34   Hypertension  Stable  Hyperlipidemia  Home meds:  No statin   LDL 80, goal < 70  Add statin  Continue statin at discharge  Other Stroke Risk Factors  Advanced age  Other Active Problems  Questionable paroxysmal atrial fibrillation, now bradycardic  Ventilator dependence in setting of inadequate airway protection  Acute kidney injury  Patient made DO NOT RESUSCITATE today  Hospital day # 5  Bruce Schultz  [facility] See Amion for Pager information 11/19/2014 2:15 PM  I have personally examined this patient, reviewed notes, independently viewed imaging studies, participated in medical decision making and plan of care. I have made any additions or clarifications  directly to the above note. Agree with note above. He developed mental status changes post hip arthroplasty with MRI showing multiple tiny bilateral scattered DWI positive lesions -fat emboli versus e,mbolic stroke.   patient's prognosis remains poor given his neurological exam and prior baseline dementia. I had a long discussion at the bedside with the patient's wife and daughter and explain this and answered questions. Plan to check EEG today to look for nonconvulsive seizures. Mental status and neurological exam does not improve over the next few days may need to consider prolonged ventilatory support with trach and PEG versus withdrawal of care . D/w Dr Molli Knock This patient is critically ill and at significant risk of neurological worsening, death and care requires constant monitoring of vital signs, hemodynamics,respiratory and cardiac monitoring, extensive review of multiple databases, frequent neurological assessment, discussion with family, other specialists and medical decision making of high complexity.I have made any additions or clarifications directly to the above note.  I spent 31 minutes of neurocritical care time  in the care of   this patient.      Delia Heady, MD Medical Director [facility] Pager: [phone] 11/19/2014 2:15 PM    To contact Stroke Continuity provider, please refer to [email or web address]. After hours, contact General Neurology

## 2014-11-19 NOTE — Procedures (Signed)
ELECTROENCEPHALOGRAM REPORT   Patient: Bruce Schultz       Room #: 1X79 EEG No. ID: [id number] Age: 81 y.o.        Sex: male Referring Physician: Molli Knock Report Date:  11/19/2014        Interpreting Physician: Thana Farr  History: Bruce Schultz is an 81 y.o. male s/p hip fracture and repair who experienced a seizure and altered mental status  Medications:  Scheduled: . antiseptic oral rinse  7 mL Mouth Rinse QID  . [START ON 11/20/2014] aspirin  300 mg Rectal Daily  . chlorhexidine  15 mL Mouth Rinse BID  . docusate sodium  100 mg Oral BID  . escitalopram  10 mg Per Tube Daily  . famotidine (PEPCID) IV  20 mg Intravenous Q12H  . feeding supplement (PRO-STAT 64)  30 mL Per Tube Daily  . levETIRAcetam  500 mg Intravenous Q12H  . pantoprazole (PROTONIX) IV  40 mg Intravenous Q24H  . senna  1 tablet Oral BID  . valproate sodium  500 mg Intravenous Q12H    Conditions of Recording:  This is a 16 channel EEG carried out with the patient in the intubated state.  Patient is not sedated during the recording.    Description:  The background activity is dominated by a low voltage polymorphic delta activity with some intermixed theta activity seen.  This activity is continuous and diffusely distributed.  There are also intermittent discharges of triphasic morphology that are noted as well.  The pateint has multiple episodes of foot jerking during the recording.  There is no change in background rhythm during the recording and no occurrence at the times that triphasic activity is noted.  Hyperventilation and intermittent photic stimulation were not performed.   IMPRESSION: This is an abnormal electroencephalogram secondary to general background slowing and intermittent triphasic waves.  This finding is most consistent with an encephalopathy of which the etiology is nonspecific, but often associated with hepatic disease.  There were multiple episodes of foot jerking during the recording  without electroencephalogram correlate.  Marland Kitchen   Thana Farr, MD [facility] [phone] 11/19/2014, 6:15 PM

## 2014-11-19 NOTE — Consult Note (Signed)
PULMONARY / CRITICAL CARE MEDICINE   Name: Bruce Schultz MRN: [id number] DOB: May 28, 1926    ADMISSION DATE:  11/14/2014 CONSULTATION DATE:  6/21  REFERRING MD :  Catha Gosselin   CHIEF COMPLAINT:  Acute respiratory failure   INITIAL PRESENTATION:   81 year old male admitted 6/18 for repair of right hip fracture. Surgery was unremarkable. On the following day he was found unresponsive w/ clinical evidence of seizure and noted on central tele to be in AF w/ RVR HR 150s. Remained unresponsive w/ on-going neuro work-up suggesting embolic CVA. Transferred to the ICU on 6/21 after becoming bradycardic and no longer protecting airway.   STUDIES:  EEG 6/20: abnormal prolonged EEG monitoring due to the presence of general background slowing consistent with a diffuse disturbance that is etiologically nonspecific but may include a metabolic encephalopathy among other possibilities. Left gaze preference was not asociated with ictal activities.  Renal US 6/20: negative for hydro  MR brain 6/20: 1. Numerous punctate foci of restricted diffusion throughout the brain, with superior MCA territory involvement predominating. Appearance is most suggestive of widespread lacunar infarcts as the sequelae of cardiac or proximal aortic emboli.2. No associated mass effect or acute hemorrhage. 3. Areas of chronic hemorrhage and encephalomalacia in the right hemisphere and moderate for age signal changes suggesting chronic small vessel disease.  SIGNIFICANT EVENTS:   HISTORY OF PRESENT ILLNESS:   81 year old male admitted on 12/03/2014 as a transfer from [facility] with right hip fracture after falling. Past surgical history includes hypertension, hyperlipidemia as well as depression. He underwent right THR on day of admission. On 6/19 he was found in SVT w/ HR 90s and he was unresponsive w/ seizure-like activity. STAT CT head was obtained. Neuro had seen and felt he had had new seizure. F/u EEG obtained later that  day did not show epileptic activity. Remained unresponsive. MRI was performed on 11/17/14 which showed numerous punctate foci of her strict in diffusion throughout the brain with superior MCA territory involvement predominating. Appearance is most suggestive of widespread lacunar infarcts as the sequela of cardiac or proximal aortic emboli. No associated new hemorrhage was noted. On 11/18/14 at 917 AM nursing progress note noted pulse rate of 42 bpm with blood pressure 132/41 and that patient was unresponsive to sternal rub. A rapid response was called. Stroke team was notified. He was transferred to 3M07 intensive care. PCCM asked to assume care.    We are now being asked to see him secondary to possible atrial fibrillation. Upon close inspection of EKG on 11/18/14 at 10:07 AM, there appears to be low-voltage P waves in lead 2 preceding QRS complexes with premature junctional beats. Does not appear to be atrial fibrillation. Heart rate 47 bpm.  Subjective: no events overnight, remains completely unresponsive.  VITAL SIGNS: Temp:  [97.1 F (36.2 C)-98.4 F (36.9 C)] 98 F (36.7 C) (06/22 0400) Pulse Rate:  [36-81] 57 (06/22 0829) Resp:  [8-23] 17 (06/22 0829) BP: (103-181)/(43-75) 147/58 mmHg (06/22 0829) SpO2:  [100 %] 100 % (06/22 0829) FiO2 (%):  [30 %-100 %] 30 % (06/22 0749) Weight:  [69.718 kg (153 lb 11.2 oz)] 69.718 kg (153 lb 11.2 oz) (06/22 0733)   HEMODYNAMICS:     VENTILATOR SETTINGS: Vent Mode:  [-] PRVC FiO2 (%):  [30 %-100 %] 30 % Set Rate:  [12 bmp-15 bmp] 12 bmp Vt Set:  [600 mL] 600 mL PEEP:  [5 cmH20] 5 cmH20 Pressure Support:  [5 cmH20] 5 cmH20 Plateau Pressure:  [  15 cmH20-19 cmH20] 16 cmH20   INTAKE / OUTPUT:  Intake/Output Summary (Last 24 hours) at 11/19/14 1610 Last data filed at 11/19/14 0800  Gross per 24 hour  Intake   2370 ml  Output    895 ml  Net   1475 ml   PHYSICAL EXAMINATION: General:  81 year old male, unresponsive on vent.  Neuro:   Unresponsive, does not withdraw to pain. HEENT:  Orally intubated, [place]/AT, PERRL, EOM-I. Cardiovascular:  Sinuts and bradycardic. Lungs:  Scattered rhonchi  Abdomen:  Soft, non-tender  Musculoskeletal:  Intact  Skin:  Scattered areas of ecchymosis   LABS:  CBC  Recent Labs Lab 11/17/14 0510 11/18/14 0602 11/19/14 0600  WBC 9.2 7.9 5.9  HGB 9.9* 9.4* 8.6*  HCT 29.0* 28.1* 25.0*  PLT 101* 80* 75*   Coag's  Recent Labs Lab 11/19/2014 0501  APTT 32  INR 1.26   BMET  Recent Labs Lab 11/17/14 0510 11/18/14 0602 11/19/14 0600  NA 135 141 144  K 4.6 4.1 3.5  CL 107 112* 115*  CO2 BUN 41* 36* 34*  CREATININE 1.93* 1.41* 1.10  GLUCOSE 104* 80 94   Electrolytes  Recent Labs Lab 11/16/14 0520 11/17/14 0510 11/18/14 0602 11/19/14 0600  CALCIUM 8.8* 7.6* 7.8* 7.9*  MG 1.9  --   --  2.0  PHOS  --   --   --  2.1*   Sepsis Markers No results for input(s): LATICACIDVEN, PROCALCITON, O2SATVEN in the last 168 hours. ABG  Recent Labs Lab 11/18/14 1233 11/19/14 0423  PHART 7.539* 7.454*  PCO2ART 29.6* 30.6*  PO2ART 433.0* 142*   Liver Enzymes  Recent Labs Lab 11/16/14 0520  AST 41  ALT 18  ALKPHOS 36*  BILITOT 1.0  ALBUMIN 2.5*   Cardiac Enzymes No results for input(s): TROPONINI, PROBNP in the last 168 hours. Glucose  Recent Labs Lab 11/19/14 0117 11/19/14 0148 11/19/14 0337 11/19/14 0357 11/19/14 0817  GLUCAP 41* 81 66 78 75   Imaging Ct Angio Head W/cm &/or Wo Cm  11/18/2014   CLINICAL DATA:  Evaluate multiple BILATERAL acute infarcts. Recent surgical repair of femoral neck fracture 11/27/2014.  EXAM: CT ANGIOGRAPHY HEAD AND NECK  TECHNIQUE: Multidetector CT imaging of the head and neck was performed using the standard protocol during bolus administration of intravenous contrast. Multiplanar CT image reconstructions and MIPs were obtained to evaluate the vascular anatomy. Carotid stenosis measurements (when applicable) are obtained  utilizing NASCET criteria, using the distal internal carotid diameter as the denominator.  CONTRAST:  60mL OMNIPAQUE IOHEXOL 350 MG/ML SOLN  COMPARISON:  MRI brain 11/17/2014.  FINDINGS: The patient was unable to remain motionless for the exam. Small or subtle lesions could be overlooked.  CT HEAD  Calvarium and skull base: No fracture or destructive lesion. Mastoids and middle ears are grossly clear.  Paranasal sinuses: Imaged portions are clear.  Orbits: Negative.  Brain: No visible acute infarct; the areas of acute infarction are much better visualized on MR. No hemorrhage, hydrocephalus, mass lesion, or extra-axial fluid.  Remote RIGHT frontal infarction is stable. Advanced chronic microvascular ischemic change throughout the subcortical and periventricular white matter.  CTA NECK  Aortic arch: Standard branching. Imaged portion shows no evidence of aneurysm or dissection. Major arch vessel origins incompletely visualized  Right carotid system: Mild calcific plaque. No evidence of dissection, stenosis (50% or greater) or occlusion.  Left carotid system: Mild calcific plaque. No evidence of dissection, stenosis (50% or greater) or  occlusion.  Vertebral arteries: RIGHT vertebral dominant. LEFT vertebral severely diseased throughout its course. No evidence of dissection, stenosis (50% or greater) or occlusion.  Nonvascular structures: Cervical spondylosis. Ossification posterior longitudinal ligament. Patient is intubated. Mild fluid in the LEFT major fissure. No pneumothorax. No lung nodule. No neck masses.  CTA HEAD  Anterior circulation: Mild non stenotic cavernous and supraclinoid plaque. No significant stenosis, proximal occlusion, aneurysm, or vascular malformation.  Posterior circulation: RIGHT vertebral is the dominant/sole contributor to the basilar; LEFT vertebral supplies mainly the inferior cerebellum. No significant stenosis, proximal occlusion, aneurysm, or vascular malformation.  Venous sinuses: As  permitted by contrast timing, patent.  Anatomic variants: BILATERAL fetal PCA origins contribute to slight basilar hypoplasia  Delayed phase:   No abnormal intracranial enhancement.  IMPRESSION: No significant extracranial or intracranial flow reducing lesion is evident. The observed pattern of cerebral ischemia on MR is likely due to cardiac emboli, or potentially watershed phenomenon.   Electronically Signed   By: Elsie Stain M.D.   On: 11/18/2014 19:00   Ct Angio Neck W/cm &/or Wo/cm  11/18/2014   CLINICAL DATA:  Evaluate multiple BILATERAL acute infarcts. Recent surgical repair of femoral neck fracture 11-27-14.  EXAM: CT ANGIOGRAPHY HEAD AND NECK  TECHNIQUE: Multidetector CT imaging of the head and neck was performed using the standard protocol during bolus administration of intravenous contrast. Multiplanar CT image reconstructions and MIPs were obtained to evaluate the vascular anatomy. Carotid stenosis measurements (when applicable) are obtained utilizing NASCET criteria, using the distal internal carotid diameter as the denominator.  CONTRAST:  60mL OMNIPAQUE IOHEXOL 350 MG/ML SOLN  COMPARISON:  MRI brain 11/17/2014.  FINDINGS: The patient was unable to remain motionless for the exam. Small or subtle lesions could be overlooked.  CT HEAD  Calvarium and skull base: No fracture or destructive lesion. Mastoids and middle ears are grossly clear.  Paranasal sinuses: Imaged portions are clear.  Orbits: Negative.  Brain: No visible acute infarct; the areas of acute infarction are much better visualized on MR. No hemorrhage, hydrocephalus, mass lesion, or extra-axial fluid.  Remote RIGHT frontal infarction is stable. Advanced chronic microvascular ischemic change throughout the subcortical and periventricular white matter.  CTA NECK  Aortic arch: Standard branching. Imaged portion shows no evidence of aneurysm or dissection. Major arch vessel origins incompletely visualized  Right carotid system: Mild  calcific plaque. No evidence of dissection, stenosis (50% or greater) or occlusion.  Left carotid system: Mild calcific plaque. No evidence of dissection, stenosis (50% or greater) or occlusion.  Vertebral arteries: RIGHT vertebral dominant. LEFT vertebral severely diseased throughout its course. No evidence of dissection, stenosis (50% or greater) or occlusion.  Nonvascular structures: Cervical spondylosis. Ossification posterior longitudinal ligament. Patient is intubated. Mild fluid in the LEFT major fissure. No pneumothorax. No lung nodule. No neck masses.  CTA HEAD  Anterior circulation: Mild non stenotic cavernous and supraclinoid plaque. No significant stenosis, proximal occlusion, aneurysm, or vascular malformation.  Posterior circulation: RIGHT vertebral is the dominant/sole contributor to the basilar; LEFT vertebral supplies mainly the inferior cerebellum. No significant stenosis, proximal occlusion, aneurysm, or vascular malformation.  Venous sinuses: As permitted by contrast timing, patent.  Anatomic variants: BILATERAL fetal PCA origins contribute to slight basilar hypoplasia  Delayed phase:   No abnormal intracranial enhancement.  IMPRESSION: No significant extracranial or intracranial flow reducing lesion is evident. The observed pattern of cerebral ischemia on MR is likely due to cardiac emboli, or potentially watershed phenomenon.   Electronically Signed   By:  Elsie Stain M.D.   On: 11/18/2014 19:00   Dg Chest Port 1 View  11/19/2014   CLINICAL DATA:  Intubation, RIGHT hip fracture, respiratory failure, bradycardia, history hypertension, hyperlipidemia  EXAM: PORTABLE CHEST - 1 VIEW  COMPARISON:  Portable exam 0617 hours compared 07/06/2019 2016  FINDINGS: Tip of endotracheal tube projects 1.8 cm above carina.  LEFT jugular central venous catheter tip projects over SVC.  Normal heart size, mediastinal contours, and pulmonary vascularity.  Lungs clear.  No pleural effusion or pneumothorax.   Bones demineralized.  IMPRESSION: Satisfactory endotracheal tube position.  No acute abnormalities.   Electronically Signed   By: Ulyses Southward M.D.   On: 11/19/2014 08:07   Portable Chest Xray  11/18/2014   CLINICAL DATA:  Hypoxia  EXAM: PORTABLE CHEST - 1 VIEW  COMPARISON:  November 14, 2014  FINDINGS: Endotracheal tube tip is 2.9 cm above the carina. Central catheter tip is in the superior vena cava. There is no pneumothorax. There is no edema or consolidation. The heart size and pulmonary vascularity are normal. No adenopathy. No bone lesions.  IMPRESSION: Tube and catheter positions as described without pneumothorax. No edema or consolidation.   Electronically Signed   By: Bretta Bang III M.D.   On: 11/18/2014 12:50   ASSESSMENT / PLAN:  PULMONARY OETT 6/21>>> A: Ventilator dependence in setting of inadequate airway protection  P:   Begin PS trials but no extubation due to mental status. F/u abg and cxr. Not candidate for trach/peg.  CARDIOVASCULAR CVL A:  PAF on 6/19  Now bradycardia 6/21 P:  Cont tele Avoid AV blocking agents Supportive care   RENAL A:   AKI-->improving  P:   Keep euvolemic. Strict I&O. Hold ACE-I. Renal dose meds. Replace electrolytes as indicated.  GASTROINTESTINAL A:   Prob protein cal malnutrition  P:   Supportive care  TF per nutrition. Place OGT.  HEMATOLOGIC A:   Anemia of critical illness Thrombocytopenia  P:  Trend cbc  PAS   INFECTIOUS A:   No acute   P:   Blood cultures   ENDOCRINE A:   No acute  P:   Trend glucose   NEUROLOGIC A:   Embolic CVA Probable seizure  COMA P:   RASS goal: -2 PAD protocol  Cont AEDs  FAMILY  - Updates: No family bedside.  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 Now intubated. Made DNR. We will support for now but need to transition to comfort.  Discussed with Dr. Pearlean Brownie, no reasonable  chance of recovery here.  Will support for a "couple of days" then will likely transition to comfort care.  The patient is critically ill with multiple organ systems failure and requires high complexity decision making for assessment and support, frequent evaluation and titration of therapies, application of advanced monitoring technologies and extensive interpretation of multiple databases.   Critical Care Time devoted to patient care services described in this note is  35  Minutes. This time reflects time of care of this signee Dr Koren Bound. This critical care time does not reflect procedure time, or teaching time or supervisory time of PA/NP/Med student/Med Resident etc but could involve care discussion time.  Alyson Reedy, M.D. [facility] Pulmonary/Critical Care Medicine. Pager: [phone]. After hours pager: [phone].

## 2014-11-19 NOTE — Progress Notes (Signed)
Patient is now experiencing hematuria. Urine output is low but adequate. E-Link notified. Will continue to monitor closely. Bruce Schultz

## 2014-11-19 NOTE — Progress Notes (Signed)
Pt placed back on full support due to no respiratory effort RN at bedside.

## 2014-11-19 NOTE — Progress Notes (Signed)
SLP Cancellation Note  Patient Details Name: JORAM LEIBY MRN: [id number] DOB: [birth year]   Cancelled treatment:       Reason Eval/Treat Not Completed: Medical issues which prohibited therapy.  Pt intubated.  Will sign off.    Blenda Mounts Laurice 11/19/2014, 9:28 AM

## 2014-11-20 ENCOUNTER — Inpatient Hospital Stay (HOSPITAL_COMMUNITY): Payer: Medicare Other

## 2014-11-20 DIAGNOSIS — I639 Cerebral infarction, unspecified: Secondary | ICD-10-CM

## 2014-11-20 LAB — CBC
HCT: 25.6 % — ABNORMAL LOW (ref 39.0–52.0)
HEMOGLOBIN: 8.6 g/dL — AB (ref 13.0–17.0)
MCH: 29.5 pg (ref 26.0–34.0)
MCHC: 33.6 g/dL (ref 30.0–36.0)
MCV: 87.7 fL (ref 78.0–100.0)
Platelets: 72 10*3/uL — ABNORMAL LOW (ref 150–400)
RBC: 2.92 MIL/uL — ABNORMAL LOW (ref 4.22–5.81)
RDW: 15.2 % (ref 11.5–15.5)
WBC: 4.8 10*3/uL (ref 4.0–10.5)

## 2014-11-20 LAB — GLUCOSE, CAPILLARY
GLUCOSE-CAPILLARY: 130 mg/dL — AB (ref 65–99)
GLUCOSE-CAPILLARY: 136 mg/dL — AB (ref 65–99)
GLUCOSE-CAPILLARY: 140 mg/dL — AB (ref 65–99)
GLUCOSE-CAPILLARY: 96 mg/dL (ref 65–99)
Glucose-Capillary: 172 mg/dL — ABNORMAL HIGH (ref 65–99)
Glucose-Capillary: 164 mg/dL — ABNORMAL HIGH (ref 65–99)
Glucose-Capillary: 119 mg/dL — ABNORMAL HIGH (ref 65–99)

## 2014-11-20 LAB — BLOOD GAS, ARTERIAL
ACID-BASE DEFICIT: 3.4 mmol/L — AB (ref 0.0–2.0)
BICARBONATE: 19.9 meq/L — AB (ref 20.0–24.0)
DRAWN BY: 413081
FIO2: 0.3 %
MECHVT: 600 mL
O2 Saturation: 98.9 %
PCO2 ART: 28.8 mmHg — AB (ref 35.0–45.0)
PEEP: 5 cmH2O
Patient temperature: 98.6
RATE: 12 resp/min
TCO2: 20.7 mmol/L (ref 0–100)
pH, Arterial: 7.454 — ABNORMAL HIGH (ref 7.350–7.450)
pO2, Arterial: 137 mmHg — ABNORMAL HIGH (ref 80.0–100.0)

## 2014-11-20 LAB — BASIC METABOLIC PANEL
Anion gap: 3 — ABNORMAL LOW (ref 5–15)
BUN: 30 mg/dL — ABNORMAL HIGH (ref 6–20)
CO2: 24 mmol/L (ref 22–32)
Calcium: 8 mg/dL — ABNORMAL LOW (ref 8.9–10.3)
Chloride: 119 mmol/L — ABNORMAL HIGH (ref 101–111)
Creatinine, Ser: 1.08 mg/dL (ref 0.61–1.24)
GFR calc Af Amer: >60 mL/min (ref >60)
GFR calc non Af Amer: 60 mL/min — ABNORMAL LOW (ref >60)
GLUCOSE: 144 mg/dL — AB (ref 65–99)
Potassium: 3.9 mmol/L (ref 3.5–5.1)
Sodium: 146 mmol/L — ABNORMAL HIGH (ref 135–145)

## 2014-11-20 LAB — PHOSPHORUS: PHOSPHORUS: 2 mg/dL — AB (ref 2.5–4.6)

## 2014-11-20 LAB — MAGNESIUM: Magnesium: 2 mg/dL (ref 1.7–2.4)

## 2014-11-20 LAB — OCCULT BLOOD X 1 CARD TO LAB, STOOL: FECAL OCCULT BLD: NEGATIVE

## 2014-11-20 MED ORDER — AMIODARONE HCL IN DEXTROSE 360-4.14 MG/200ML-% IV SOLN
60.0000 mg/h | INTRAVENOUS | Status: AC
  Administered 2014-11-20 (×2): 60 mg/h via INTRAVENOUS
  Filled 2014-11-20: qty 200

## 2014-11-20 MED ORDER — AMIODARONE HCL IN DEXTROSE 360-4.14 MG/200ML-% IV SOLN
30.0000 mg/h | INTRAVENOUS | Status: DC
  Administered 2014-11-20 – 2014-11-21 (×2): 30 mg/h via INTRAVENOUS
  Filled 2014-11-20 (×8): qty 200

## 2014-11-20 MED ORDER — SODIUM CHLORIDE 0.9 % IV BOLUS (SEPSIS)
500.0000 mL | Freq: Once | INTRAVENOUS | Status: AC
  Administered 2014-11-20: 500 mL via INTRAVENOUS

## 2014-11-20 MED ORDER — AMIODARONE LOAD VIA INFUSION
150.0000 mg | Freq: Once | INTRAVENOUS | Status: AC
  Administered 2014-11-20: 150 mg via INTRAVENOUS
  Filled 2014-11-20: qty 83.34

## 2014-11-20 NOTE — Progress Notes (Signed)
Pt continuing to experience hematuria. U/O adequate. Followed up with E-link. No new orders given. Will continue to monitor output closely.

## 2014-11-20 NOTE — Progress Notes (Signed)
VASCULAR LAB PRELIMINARY  PRELIMINARY  PRELIMINARY  PRELIMINARY  Carotid duplex completed.    Preliminary report:  Bilateral:  1-39% ICA stenosis.  Right:  Vertebral artery flow is antegrade.  Study limited by IJ line.  Unable to image left vertebral, proximal CCA, and distal ICA.  Rilley Stash, RVT 11/20/2014, 11:00 AM

## 2014-11-20 NOTE — Progress Notes (Signed)
STROKE TEAM PROGRESS NOTE   HISTORY Bruce Schultz is an 81 y.o. male hx of HTN, HLD, depression admitted on 6/18 after suffering a fall at home with resultant R femoral neck fracture. Had successful surgical repair on 6/18 with no complications. Post procedure was doing well. LSW 0130 on 6/19. Around 0430 noted to be unresponsive with left gaze deviation, would withdrawal symmetrically to noxious stimuli. Shortly after RN noted facial twitching which progressed to involve his head turned to the left with right arm extended and repetitive flexion of LUE. Episode lasted around 30 seconds to 1 minute. Eyes midline after episode but he remains unresponsive. He received ativan 2mg  x 1 and loaded with keppra 1000mg  x 1. Seen by neurology. Following day seizure activity has slowed the patient remained unresponsive. Continuous EEG monitoring started an MRI ordered. MRI showed small bilateral cortical and subcortical infarcts. Patient was transferred to the stroke service for follow-up.   SUBJECTIVE (INTERVAL HISTORY) Patient has remained unresponsive despite not being on sedation. Patient's wife and daughter are at the bedside and they agreed on DO NOT RESUSCITATE and will consider withdrawal of care in the next few days if there is no significant improvement OBJECTIVE Temp:  [98.3 F (36.8 C)-99.2 F (37.3 C)] 98.7 F (37.1 C) (06/23 1151) Pulse Rate:  [25-148] 62 (06/23 1300) Cardiac Rhythm:  [-] Sinus tachycardia (06/23 1151) Resp:  [12-26] 15 (06/23 1300) BP: (82-181)/(50-89) 99/54 mmHg (06/23 1300) SpO2:  [95 %-100 %] 100 % (06/23 1300) FiO2 (%):  [30 %] 30 % (06/23 1144) Weight:  [156 lb 15.5 oz (71.2 kg)] 156 lb 15.5 oz (71.2 kg) (06/23 0447)   Recent Labs Lab 11/19/14 1953 11/20/14 0029 11/20/14 0410 11/20/14 0757 11/20/14 1143  GLUCAP 81 96 130* 140* 172*    Recent Labs Lab 11/16/14 0520 11/17/14 0510 11/18/14 0602 11/19/14 0600 11/20/14 0440  NA 139 135 141 144 146*  K  4.3 4.6 4.1 3.5 3.9  CL 109 107 112* 115* 119*  CO2 11* 23 24 24 24   GLUCOSE 208* 104* 80 94 144*  BUN 24* 41* 36* 34* 30*  CREATININE 1.65* 1.93* 1.41* 1.10 1.08  CALCIUM 8.8* 7.6* 7.8* 7.9* 8.0*  MG 1.9  --   --  2.0 2.0  PHOS  --   --   --  2.1* 2.0*    Recent Labs Lab 11/16/14 0520  AST 41  ALT 18  ALKPHOS 36*  BILITOT 1.0  PROT 5.5*  ALBUMIN 2.5*    Recent Labs Lab 12/15/14 0501  11/16/14 0520 11/17/14 0510 11/18/14 0602 11/19/14 0600 11/20/14 0440  WBC 6.8  --  12.0* 9.2 7.9 5.9 4.8  NEUTROABS 5.0  --   --   --   --   --   --   HGB 12.3*  < > 12.8* 9.9* 9.4* 8.6* 8.6*  HCT 37.3*  < > 38.5* 29.0* 28.1* 25.0* 25.6*  MCV 88.2  --  89.1 87.9 88.4 86.8 87.7  PLT 136*  --  128* 101* 80* 75* 72*  < > = values in this interval not displayed. No results for input(s): CKTOTAL, CKMB, CKMBINDEX, TROPONINI in the last 168 hours. No results for input(s): LABPROT, INR in the last 72 hours. No results for input(s): COLORURINE, LABSPEC, PHURINE, GLUCOSEU, HGBUR, BILIRUBINUR, KETONESUR, PROTEINUR, UROBILINOGEN, NITRITE, LEUKOCYTESUR in the last 72 hours.  Invalid input(s): APPERANCEUR     Component Value Date/Time   CHOL 132 11/17/2014 1620   TRIG 92 11/17/2014 1620  HDL 34* 11/17/2014 1620   CHOLHDL 3.9 11/17/2014 1620   VLDL 18 11/17/2014 1620   LDLCALC 80 11/17/2014 1620   Lab Results  Component Value Date   HGBA1C 5.8* 11/17/2014   No results found for: LABOPIA, COCAINSCRNUR, LABBENZ, AMPHETMU, THCU, LABBARB  No results for input(s): ETH in the last 168 hours.  Ct Angio Head W/cm &/or Wo Cm  11/18/2014   CLINICAL DATA:  Evaluate multiple BILATERAL acute infarcts. Recent surgical repair of femoral neck fracture 11/21/2014.  EXAM: CT ANGIOGRAPHY HEAD AND NECK  TECHNIQUE: Multidetector CT imaging of the head and neck was performed using the standard protocol during bolus administration of intravenous contrast. Multiplanar CT image reconstructions and MIPs were  obtained to evaluate the vascular anatomy. Carotid stenosis measurements (when applicable) are obtained utilizing NASCET criteria, using the distal internal carotid diameter as the denominator.  CONTRAST:  60mL OMNIPAQUE IOHEXOL 350 MG/ML SOLN  COMPARISON:  MRI brain 11/17/2014.  FINDINGS: The patient was unable to remain motionless for the exam. Small or subtle lesions could be overlooked.  CT HEAD  Calvarium and skull base: No fracture or destructive lesion. Mastoids and middle ears are grossly clear.  Paranasal sinuses: Imaged portions are clear.  Orbits: Negative.  Brain: No visible acute infarct; the areas of acute infarction are much better visualized on MR. No hemorrhage, hydrocephalus, mass lesion, or extra-axial fluid.  Remote RIGHT frontal infarction is stable. Advanced chronic microvascular ischemic change throughout the subcortical and periventricular white matter.  CTA NECK  Aortic arch: Standard branching. Imaged portion shows no evidence of aneurysm or dissection. Major arch vessel origins incompletely visualized  Right carotid system: Mild calcific plaque. No evidence of dissection, stenosis (50% or greater) or occlusion.  Left carotid system: Mild calcific plaque. No evidence of dissection, stenosis (50% or greater) or occlusion.  Vertebral arteries: RIGHT vertebral dominant. LEFT vertebral severely diseased throughout its course. No evidence of dissection, stenosis (50% or greater) or occlusion.  Nonvascular structures: Cervical spondylosis. Ossification posterior longitudinal ligament. Patient is intubated. Mild fluid in the LEFT major fissure. No pneumothorax. No lung nodule. No neck masses.  CTA HEAD  Anterior circulation: Mild non stenotic cavernous and supraclinoid plaque. No significant stenosis, proximal occlusion, aneurysm, or vascular malformation.  Posterior circulation: RIGHT vertebral is the dominant/sole contributor to the basilar; LEFT vertebral supplies mainly the inferior  cerebellum. No significant stenosis, proximal occlusion, aneurysm, or vascular malformation.  Venous sinuses: As permitted by contrast timing, patent.  Anatomic variants: BILATERAL fetal PCA origins contribute to slight basilar hypoplasia  Delayed phase:   No abnormal intracranial enhancement.  IMPRESSION: No significant extracranial or intracranial flow reducing lesion is evident. The observed pattern of cerebral ischemia on MR is likely due to cardiac emboli, or potentially watershed phenomenon.   Electronically Signed   By: Elsie Stain M.D.   On: 11/18/2014 19:00   Ct Angio Neck W/cm &/or Wo/cm  11/18/2014   CLINICAL DATA:  Evaluate multiple BILATERAL acute infarcts. Recent surgical repair of femoral neck fracture 11/07/2014.  EXAM: CT ANGIOGRAPHY HEAD AND NECK  TECHNIQUE: Multidetector CT imaging of the head and neck was performed using the standard protocol during bolus administration of intravenous contrast. Multiplanar CT image reconstructions and MIPs were obtained to evaluate the vascular anatomy. Carotid stenosis measurements (when applicable) are obtained utilizing NASCET criteria, using the distal internal carotid diameter as the denominator.  CONTRAST:  60mL OMNIPAQUE IOHEXOL 350 MG/ML SOLN  COMPARISON:  MRI brain 11/17/2014.  FINDINGS: The patient was unable  to remain motionless for the exam. Small or subtle lesions could be overlooked.  CT HEAD  Calvarium and skull base: No fracture or destructive lesion. Mastoids and middle ears are grossly clear.  Paranasal sinuses: Imaged portions are clear.  Orbits: Negative.  Brain: No visible acute infarct; the areas of acute infarction are much better visualized on MR. No hemorrhage, hydrocephalus, mass lesion, or extra-axial fluid.  Remote RIGHT frontal infarction is stable. Advanced chronic microvascular ischemic change throughout the subcortical and periventricular white matter.  CTA NECK  Aortic arch: Standard branching. Imaged portion shows no  evidence of aneurysm or dissection. Major arch vessel origins incompletely visualized  Right carotid system: Mild calcific plaque. No evidence of dissection, stenosis (50% or greater) or occlusion.  Left carotid system: Mild calcific plaque. No evidence of dissection, stenosis (50% or greater) or occlusion.  Vertebral arteries: RIGHT vertebral dominant. LEFT vertebral severely diseased throughout its course. No evidence of dissection, stenosis (50% or greater) or occlusion.  Nonvascular structures: Cervical spondylosis. Ossification posterior longitudinal ligament. Patient is intubated. Mild fluid in the LEFT major fissure. No pneumothorax. No lung nodule. No neck masses.  CTA HEAD  Anterior circulation: Mild non stenotic cavernous and supraclinoid plaque. No significant stenosis, proximal occlusion, aneurysm, or vascular malformation.  Posterior circulation: RIGHT vertebral is the dominant/sole contributor to the basilar; LEFT vertebral supplies mainly the inferior cerebellum. No significant stenosis, proximal occlusion, aneurysm, or vascular malformation.  Venous sinuses: As permitted by contrast timing, patent.  Anatomic variants: BILATERAL fetal PCA origins contribute to slight basilar hypoplasia  Delayed phase:   No abnormal intracranial enhancement.  IMPRESSION: No significant extracranial or intracranial flow reducing lesion is evident. The observed pattern of cerebral ischemia on MR is likely due to cardiac emboli, or potentially watershed phenomenon.   Electronically Signed   By: Elsie Stain M.D.   On: 11/18/2014 19:00   Dg Chest Port 1 View  11/20/2014   CLINICAL DATA:  Check endotracheal tube placement  EXAM: PORTABLE CHEST - 1 VIEW  COMPARISON:  11/19/2014  FINDINGS: Cardiac shadow is within normal limits. A left jugular central line, endotracheal tube and nasogastric catheter are again identified in stable position. The lungs are well aerated bilaterally. No bony abnormality is noted.  IMPRESSION:  Tubes and lines as described.  No acute abnormality noted.   Electronically Signed   By: Alcide Clever M.D.   On: 11/20/2014 07:53   Dg Chest Port 1 View  11/19/2014   CLINICAL DATA:  Intubation, RIGHT hip fracture, respiratory failure, bradycardia, history hypertension, hyperlipidemia  EXAM: PORTABLE CHEST - 1 VIEW  COMPARISON:  Portable exam 0617 hours compared 07/06/2019 2016  FINDINGS: Tip of endotracheal tube projects 1.8 cm above carina.  LEFT jugular central venous catheter tip projects over SVC.  Normal heart size, mediastinal contours, and pulmonary vascularity.  Lungs clear.  No pleural effusion or pneumothorax.  Bones demineralized.  IMPRESSION: Satisfactory endotracheal tube position.  No acute abnormalities.   Electronically Signed   By: Ulyses Southward M.D.   On: 11/19/2014 08:07   Dg Abd Portable 1v  11/19/2014   CLINICAL DATA:  Stroke.  OG tube placement  EXAM: PORTABLE ABDOMEN - 1 VIEW  COMPARISON:  None.  FINDINGS: Enteric tube tip is in the mid stomach. Mild gaseous distention of visualize colon. Visualized lung fields are clear.  IMPRESSION: Enteric tube tip in the mid stomach.   Electronically Signed   By: Charlett Nose M.D.   On: 11/19/2014 12:31  2-D echocardiogram - Procedure narrative: Transthoracic echocardiography. Imagequality was suboptimal. The study was technically difficult, as aresult of poor acoustic windows, poor sound wave transmission,chest wall deformity, and body habitus. - Left ventricle: Can not comment on function based on currentimages.  EEG 11/19/2014 : This is an abnormal electroencephalogram secondary to general background slowing and intermittent triphasic waves. This finding is most consistent with an encephalopathy of which the etiology is nonspecific, but often associated with hepatic disease. There were multiple episodes of foot jerking during the recording without electroencephalogram correlate  PHYSICAL EXAM Frail elderly male not sedated .  intubated. . Afebrile. Head is nontraumatic. Neck is supple without bruit.    Cardiac exam no murmur or gallop. Lungs are clear to auscultation. Distal pulses are well felt. Neurological Exam   patient  ios intubated. Eyes are closed.  Pupils small irregular sluggishly reactive. Left eye corneal opacity. Fundi could not be visualized. Tongue midline. Doll`s eye movements are sluggish. Weak corneals , cough and gag. Minimal motor response to noxious stimuli in all 4 limbs legss. legs more than arm. ASSESSMENT/PLAN Bruce Schultz is a 81 y.o. male with history of chronic knee pain who tripped and fell presenting with right femoral neck fracture status post right total hip arthroplasty 6/18 he developed unresponsiveness and left gaze deviation followed by left upper body seizure. MRI showed bilateral small infarcts.  He did not receive IV t-PA due to nonfocal presentation.  Small bilateral infarts, felt to be cardioembolic versus embolic from fat emboli post hip fracture/fall  MRI  Small bilateral infarcts. small vessel disease   CTA head and neck no large vessel stenosis or occlusion  2D Echo  Study of poor quality. LV not seen well   LDL 80  HgbA1c 5.8  SCDs for VTE prophylaxis Diet NPO time specified  aspirin 81 mg orally every day prior to admission, now on aspirin 300 mg suppository daily  Ongoing aggressive stroke risk factor management  Therapy recommendations:  pending   Disposition:  pending   Seizures  On Depacon 500 mg to 12 and Keppra 500 every 12  EEG short activity versus epileptiform activity  Valproic acid level 34   Hypertension  Stable  Hyperlipidemia  Home meds:  No statin   LDL 80, goal < 70  Add statin  Continue statin at discharge  Other Stroke Risk Factors  Advanced age  Other Active Problems  Questionable paroxysmal atrial fibrillation, now bradycardic  Ventilator dependence in setting of inadequate airway protection  Acute kidney  injury  Patient made DO NOT RESUSCITATE today  Hospital day # 6  SETHI,PRAMOD  [facility] See Amion for Pager information 11/20/2014 1:50 PM  I have personally examined this patient, reviewed notes, independently viewed imaging studies, participated in medical decision making and plan of care. I have made any additions or clarifications directly to the above note. Agree with note above. He developed mental status changes post hip arthroplasty with MRI showing multiple tiny bilateral scattered DWI positive lesions -fat emboli versus embolic strokes.   Patient's prognosis remains poor given his neurological exam and prior baseline dementia. I had a long discussion at the bedside with the patient's wife and daughter and explained this and answered questions.  Family agrees that if  Patient`s mental status and neurological exam does not improve over the next few days may need to consider  withdrawal of care . D/w Dr Molli Knock This patient is critically ill and at significant risk of neurological  worsening, death and care requires constant monitoring of vital signs, hemodynamics,respiratory and cardiac monitoring, extensive review of multiple databases, frequent neurological assessment, discussion with family, other specialists and medical decision making of high complexity.I have made any additions or clarifications directly to the above note.  I spent 34 minutes of neurocritical care time  in the care of  this patient.    Delia Heady, MD Medical Director [facility] Pager: [phone] 11/20/2014 1:50 PM    To contact Stroke Continuity provider, please refer to [email or web address]. After hours, contact General Neurology

## 2014-11-20 NOTE — Progress Notes (Signed)
OT Cancellation Note  Patient Details Name: MAJED COLGROVE MRN: [id number] DOB: 1926-05-30   Cancelled Treatment:    Reason Eval/Treat Not Completed: Other (comment) Pt transitioning to comfort care. OT signing off.  [facility] Sabra Sessler, OTR/L  [phone] 11/20/2014 11/20/2014, 10:03 PM

## 2014-11-20 NOTE — Progress Notes (Signed)
Pt began SVT sustaining 140s-160s. E-link notified. No new orders given. Will continue to monitor.

## 2014-11-20 NOTE — Progress Notes (Addendum)
PT Cancellation Note and DISCHARGE Patient Details Name: Bruce Schultz MRN: [id number] DOB: 1926/04/28   Cancelled Treatment:    Reason Eval/Treat Not Completed: Medical issues which prohibited therapy;Other (comment) (Pt is unresponsive and likely to move toward comfort care.)  Will sign off at this time. 11/20/2014  Chester Bing, PT [phone]  (pager)   Bruce Schultz, Bruce Schultz 11/20/2014, 9:37 AM

## 2014-11-20 NOTE — Care Management Note (Signed)
Case Management Note  Patient Details  Name: Bruce Schultz MRN: [id number] Date of Birth: 12/09/1925  Subjective/Objective:     Pt s/p Lt femoral neck fracture with post-op embolic CVA.  Pt remains unresponsive on ventilator.  He is a DNR, per family wishes.                 Action/Plan: Pt likely to be transitioned to comfort care if no improvement in next 24-48h.  Will follow progress.    Expected Discharge Date:                  Expected Discharge Plan:  Expired  In-House Referral:  Clinical Social Work  Discharge planning Services  CM Consult  Post Acute Care Choice:    Choice offered to:     DME Arranged:    DME Agency:     HH Arranged:    HH Agency:     Status of Service:  In process, will continue to follow  Medicare Important Message Given:    Date Medicare IM Given:    Medicare IM give by:    Date Additional Medicare IM Given:    Additional Medicare Important Message give by:     If discussed at Long Length of Stay Meetings, dates discussed:    Additional Comments:  Quintella Baton, RN, BSN  Trauma/Neuro ICU Case Manager [phone]

## 2014-11-20 NOTE — Progress Notes (Signed)
   Subjective:  Sedation stopped, remains unresponsive. Plan in place to transition to comfort care soon.  Objective:   VITALS:   Filed Vitals:   11/20/14 1151 11/20/14 1200 11/20/14 1230 11/20/14 1300  BP:  110/56 128/53 99/54  Pulse:  132 71 62  Temp: 98.7 F (37.1 C)     TempSrc: Axillary     Resp:  13 16 15   Height:      Weight:      SpO2:  100% 100% 100%    Does not follow commands Withdraws BLE to painful stimuli  Intact pulses distally Incision: dressing C/D/I  Thigh soft   Lab Results  Component Value Date   WBC 4.8 11/20/2014   HGB 8.6* 11/20/2014   HCT 25.6* 11/20/2014   MCV 87.7 11/20/2014   PLT 72* 11/20/2014   BMET    Component Value Date/Time   NA 146* 11/20/2014 0440   K 3.9 11/20/2014 0440   CL 119* 11/20/2014 0440   CO2 24 11/20/2014 0440   GLUCOSE 144* 11/20/2014 0440   BUN 30* 11/20/2014 0440   CREATININE 1.08 11/20/2014 0440   CALCIUM 8.0* 11/20/2014 0440   GFRNONAA 60* 11/20/2014 0440   GFRAA >60 11/20/2014 0440     Assessment/Plan: 5 Days Post-Op   Principal Problem:   Closed right hip fracture Active Problems:   Hypertension   Fall   Anemia   Bradycardia, sinus   Femoral neck fracture   Displaced fracture of right femoral neck   Seizure   Cerebral embolism with cerebral infarction   Acute encephalopathy   Acute respiratory failure with hypoxemia   AKI (acute kidney injury)   Cont medical care Will follow   Mayra Brahm, Cloyde Reams 11/20/2014, 3:02 PM   Samson Frederic, MD Cell [phone]

## 2014-11-20 NOTE — Progress Notes (Signed)
PULMONARY / CRITICAL CARE MEDICINE   Name: Bruce Schultz MRN: [id number] DOB: [birth year]    ADMISSION DATE:  11/14/2014 CONSULTATION DATE:  6/21  REFERRING MD :  Catha Gosselin   CHIEF COMPLAINT:  Acute respiratory failure   INITIAL PRESENTATION:   81 year old male admitted 6/18 for repair of right hip fracture. Surgery was unremarkable. On the following day he was found unresponsive w/ clinical evidence of seizure and noted on central tele to be in AF w/ RVR HR 150s. Remained unresponsive w/ on-going neuro work-up suggesting embolic CVA. Transferred to the ICU on 6/21 after becoming bradycardic and no longer protecting airway.   STUDIES:  EEG 6/20: abnormal prolonged EEG monitoring due to the presence of general background slowing consistent with a diffuse disturbance that is etiologically nonspecific but may include a metabolic encephalopathy among other possibilities. Left gaze preference was not asociated with ictal activities.  Renal US 6/20: negative for hydro  MR brain 6/20: 1. Numerous punctate foci of restricted diffusion throughout the brain, with superior MCA territory involvement predominating. Appearance is most suggestive of widespread lacunar infarcts as the sequelae of cardiac or proximal aortic emboli.2. No associated mass effect or acute hemorrhage. 3. Areas of chronic hemorrhage and encephalomalacia in the right hemisphere and moderate for age signal changes suggesting chronic small vessel disease.  SIGNIFICANT EVENTS:   HISTORY OF PRESENT ILLNESS:   81 year old male admitted on 11/06/2014 as a transfer from [facility] with right hip fracture after falling. Past surgical history includes hypertension, hyperlipidemia as well as depression. He underwent right THR on day of admission. On 6/19 he was found in SVT w/ HR 90s and he was unresponsive w/ seizure-like activity. STAT CT head was obtained. Neuro had seen and felt he had had new seizure. F/u EEG obtained later that  day did not show epileptic activity. Remained unresponsive. MRI was performed on 11/17/14 which showed numerous punctate foci of her strict in diffusion throughout the brain with superior MCA territory involvement predominating. Appearance is most suggestive of widespread lacunar infarcts as the sequela of cardiac or proximal aortic emboli. No associated new hemorrhage was noted. On 11/18/14 at 917 AM nursing progress note noted pulse rate of 42 bpm with blood pressure 132/41 and that patient was unresponsive to sternal rub. A rapid response was called. Stroke team was notified. He was transferred to 3M07 intensive care. PCCM asked to assume care.    We are now being asked to see him secondary to possible atrial fibrillation. Upon close inspection of EKG on 11/18/14 at 10:07 AM, there appears to be low-voltage P waves in lead 2 preceding QRS complexes with premature junctional beats. Does not appear to be atrial fibrillation. Heart rate 47 bpm.  Subjective: no events overnight, remains completely unresponsive.  VITAL SIGNS: Temp:  [98.3 F (36.8 C)-99.2 F (37.3 C)] 98.7 F (37.1 C) (06/23 1151) Pulse Rate:  [25-148] 127 (06/23 1150) Resp:  [12-26] 22 (06/23 1150) BP: (82-181)/(50-89) 118/78 mmHg (06/23 1150) SpO2:  [95 %-100 %] 100 % (06/23 1150) FiO2 (%):  [30 %] 30 % (06/23 1144) Weight:  [71.2 kg (156 lb 15.5 oz)] 71.2 kg (156 lb 15.5 oz) (06/23 0447)   HEMODYNAMICS:     VENTILATOR SETTINGS: Vent Mode:  [-] PRVC FiO2 (%):  [30 %] 30 % Set Rate:  [12 bmp] 12 bmp Vt Set:  [600 mL] 600 mL PEEP:  [5 cmH20] 5 cmH20 Plateau Pressure:  [16 cmH20-19 cmH20] 17 cmH20   INTAKE /  OUTPUT:  Intake/Output Summary (Last 24 hours) at 11/20/14 1242 Last data filed at 11/20/14 1000  Gross per 24 hour  Intake 2936.67 ml  Output    930 ml  Net 2006.67 ml   PHYSICAL EXAMINATION: General:  80 year old male, unresponsive on vent.  Neuro:  Unresponsive, does not withdraw to pain. HEENT:  Orally  intubated, [place]/AT, PERRL, EOM-I. Cardiovascular:  Sinuts and bradycardic. Lungs:  Scattered rhonchi  Abdomen:  Soft, non-tender  Musculoskeletal:  Intact  Skin:  Scattered areas of ecchymosis   LABS:  CBC  Recent Labs Lab 11/18/14 0602 11/19/14 0600 11/20/14 0440  WBC 7.9 5.9 4.8  HGB 9.4* 8.6* 8.6*  HCT 28.1* 25.0* 25.6*  PLT 80* 75* 72*   Coag's  Recent Labs Lab 10-Dec-2014 0501  APTT 32  INR 1.26   BMET  Recent Labs Lab 11/18/14 0602 11/19/14 0600 11/20/14 0440  NA 141 144 146*  K 4.1 3.5 3.9  CL 112* 115* 119*  CO2 24 24 24   BUN 36* 34* 30*  CREATININE 1.41* 1.10 1.08  GLUCOSE 80 94 144*   Electrolytes  Recent Labs Lab 11/16/14 0520  11/18/14 0602 11/19/14 0600 11/20/14 0440  CALCIUM 8.8*  < > 7.8* 7.9* 8.0*  MG 1.9  --   --  2.0 2.0  PHOS  --   --   --  2.1* 2.0*  < > = values in this interval not displayed. Sepsis Markers No results for input(s): LATICACIDVEN, PROCALCITON, O2SATVEN in the last 168 hours. ABG  Recent Labs Lab 11/18/14 1233 11/19/14 0423 11/20/14 0610  PHART 7.539* 7.454* 7.454*  PCO2ART 29.6* 30.6* 28.8*  PO2ART 433.0* 142* 137*   Liver Enzymes  Recent Labs Lab 11/16/14 0520  AST 41  ALT 18  ALKPHOS 36*  BILITOT 1.0  ALBUMIN 2.5*   Cardiac Enzymes No results for input(s): TROPONINI, PROBNP in the last 168 hours. Glucose  Recent Labs Lab 11/19/14 1715 11/19/14 1953 11/20/14 0029 11/20/14 0410 11/20/14 0757 11/20/14 1143  GLUCAP 80 81 96 130* 140* 172*   Imaging Dg Chest Port 1 View  11/20/2014   CLINICAL DATA:  Check endotracheal tube placement  EXAM: PORTABLE CHEST - 1 VIEW  COMPARISON:  11/19/2014  FINDINGS: Cardiac shadow is within normal limits. A left jugular central line, endotracheal tube and nasogastric catheter are again identified in stable position. The lungs are well aerated bilaterally. No bony abnormality is noted.  IMPRESSION: Tubes and lines as described.  No acute abnormality noted.    Electronically Signed   By: Alcide Clever M.D.   On: 11/20/2014 07:53   ASSESSMENT / PLAN:  PULMONARY OETT 6/21>>> A: Ventilator dependence in setting of inadequate airway protection  P:   Begin PS trials but no extubation due to mental status. Will likely terminally extubate in AM. F/u abg and cxr. Not candidate for trach/peg. DNR status.  CARDIOVASCULAR CVL A:  PAF on 6/19  A-fib with RVR this AM, normalized now on amiodarone. P:  Cont tele. Monitor. Amiodarone drip. Supportive care.  RENAL A:   AKI-->improving  P:   Keep euvolemic. Strict I&O. Hold ACE-I. Renal dose meds. Replace electrolytes as indicated.  GASTROINTESTINAL A:   Prob protein cal malnutrition  P:   Supportive care  TF per nutrition.  HEMATOLOGIC A:   Anemia of critical illness Thrombocytopenia  P:  Trend cbc. PAS.  INFECTIOUS A:   No acute  P:   Blood cultures   ENDOCRINE A:   No  acute  P:   Trend glucose   NEUROLOGIC A:   Embolic CVA Probable seizure  COMA P:   RASS goal: -2. PAD protocol. Cont AEDs.  FAMILY  - Updates: Wife updated bedside, discussed plan of care, they do not wish for him to linger this way, will start morphine in AM and terminally extubate.  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 Now intubated. Made DNR. We will support for now but need to transition to comfort.  Discussed with Dr. Pearlean Brownie, no reasonable chance of recovery here.  Terminal extubation in AM.  The patient is critically ill with multiple organ systems failure and requires high complexity decision making for assessment and support, frequent evaluation and titration of therapies, application of advanced monitoring technologies and extensive interpretation of multiple databases.   Critical Care Time devoted to patient care services described in this note is  35  Minutes. This time reflects time  of care of this signee Dr Koren Bound. This critical care time does not reflect procedure time, or teaching time or supervisory time of PA/NP/Med student/Med Resident etc but could involve care discussion time.  Alyson Reedy, M.D. [facility] Pulmonary/Critical Care Medicine. Pager: [phone]. After hours pager: [phone].

## 2014-11-20 NOTE — Progress Notes (Signed)
UOP low, blood tinged amber. Order received for 500 cc saline bolus & increase MIVF to 100cc/hr. Bolus infusing now.

## 2014-11-21 DIAGNOSIS — Z515 Encounter for palliative care: Secondary | ICD-10-CM

## 2014-11-21 LAB — GLUCOSE, CAPILLARY
Glucose-Capillary: 137 mg/dL — ABNORMAL HIGH (ref 65–99)
Glucose-Capillary: 104 mg/dL — ABNORMAL HIGH (ref 65–99)

## 2014-11-21 MED ORDER — MORPHINE SULFATE 25 MG/ML IV SOLN
10.0000 mg/h | INTRAVENOUS | Status: DC
  Administered 2014-11-21 – 2014-11-22 (×2): 10 mg/h via INTRAVENOUS
  Filled 2014-11-21 (×3): qty 10

## 2014-11-21 MED ORDER — MORPHINE BOLUS VIA INFUSION
5.0000 mg | INTRAVENOUS | Status: DC | PRN
  Filled 2014-11-21: qty 20

## 2014-11-21 MED ORDER — SCOPOLAMINE 1 MG/3DAYS TD PT72
1.0000 | MEDICATED_PATCH | TRANSDERMAL | Status: DC
  Administered 2014-11-21: 1.5 mg via TRANSDERMAL
  Filled 2014-11-21: qty 1

## 2014-11-21 NOTE — Progress Notes (Signed)
Chaplain responded to spiritual care consult for end of life. 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 Chaplain offered word of prayer, emotional support, and read sacred text at bedside. Pt family is thankful for his years with them and seems at peace with end of life. Chaplain informed pt family of her continued presence as needed.    11/21/14 1100  Clinical Encounter Type  Visited With Patient and family together  Visit Type Initial;Spiritual support  Referral From Physician  Spiritual Encounters  Spiritual Needs Emotional;Prayer;[facility] text  Stress Factors  Family Stress Factors Loss  Bruce Schultz 11/21/2014 11:20 AM

## 2014-11-21 NOTE — Progress Notes (Signed)
11/21/2014- Respiratory care note- Pt suctioned and terminally extubated at 1055 as per MD order.  Rn at bedside during procedure.  Pt placed on 3lpm nasal cannula post extubation for comfort measures.  Ventilator removed from bedside post procedure.  No complications noted during procedure.  Pt appeared comfortable.  Family brought to bedside.  s Bruce Schultz rrt, rcp

## 2014-11-21 NOTE — Progress Notes (Signed)
PULMONARY / CRITICAL CARE MEDICINE   Name: Bruce Schultz MRN: [id number] DOB: 1926-04-07    ADMISSION DATE:  11/14/2014 CONSULTATION DATE:  6/21  REFERRING MD :  Catha Gosselin   CHIEF COMPLAINT:  Acute respiratory failure   INITIAL PRESENTATION:   81 year old male admitted 6/18 for repair of right hip fracture. Surgery was unremarkable. On the following day he was found unresponsive w/ clinical evidence of seizure and noted on central tele to be in AF w/ RVR HR 150s. Remained unresponsive w/ on-going neuro work-up suggesting embolic CVA. Transferred to the ICU on 6/21 after becoming bradycardic and no longer protecting airway.   STUDIES:  EEG 6/20: abnormal prolonged EEG monitoring due to the presence of general background slowing consistent with a diffuse disturbance that is etiologically nonspecific but may include a metabolic encephalopathy among other possibilities. Left gaze preference was not asociated with ictal activities.  Renal US 6/20: negative for hydro  MR brain 6/20: 1. Numerous punctate foci of restricted diffusion throughout the brain, with superior MCA territory involvement predominating. Appearance is most suggestive of widespread lacunar infarcts as the sequelae of cardiac or proximal aortic emboli.2. No associated mass effect or acute hemorrhage. 3. Areas of chronic hemorrhage and encephalomalacia in the right hemisphere and moderate for age signal changes suggesting chronic small vessel disease.  SIGNIFICANT EVENTS:   HISTORY OF PRESENT ILLNESS:   81 year old male admitted on 12/11/14 as a transfer from [facility] with right hip fracture after falling. Past surgical history includes hypertension, hyperlipidemia as well as depression. He underwent right THR on day of admission. On 6/19 he was found in SVT w/ HR 90s and he was unresponsive w/ seizure-like activity. STAT CT head was obtained. Neuro had seen and felt he had had new seizure. F/u EEG obtained later that  day did not show epileptic activity. Remained unresponsive. MRI was performed on 11/17/14 which showed numerous punctate foci of her strict in diffusion throughout the brain with superior MCA territory involvement predominating. Appearance is most suggestive of widespread lacunar infarcts as the sequela of cardiac or proximal aortic emboli. No associated new hemorrhage was noted. On 11/18/14 at 917 AM nursing progress note noted pulse rate of 42 bpm with blood pressure 132/41 and that patient was unresponsive to sternal rub. A rapid response was called. Stroke team was notified. He was transferred to 3M07 intensive care. PCCM asked to assume care.    We are now being asked to see him secondary to possible atrial fibrillation. Upon close inspection of EKG on 11/18/14 at 10:07 AM, there appears to be low-voltage P waves in lead 2 preceding QRS complexes with premature junctional beats. Does not appear to be atrial fibrillation. Heart rate 47 bpm.  Subjective: no events overnight, remains completely unresponsive.  VITAL SIGNS: Temp:  [97.9 F (36.6 C)-98.8 F (37.1 C)] 98.1 F (36.7 C) (06/24 0813) Pulse Rate:  [47-132] 65 (06/24 0841) Resp:  [12-23] 14 (06/24 0841) BP: (96-172)/(44-82) 97/49 mmHg (06/24 0841) SpO2:  [100 %] 100 % (06/24 0841) FiO2 (%):  [30 %] 30 % (06/24 0841) Weight:  [74.5 kg (164 lb 3.9 oz)] 74.5 kg (164 lb 3.9 oz) (06/24 0500)   HEMODYNAMICS:     VENTILATOR SETTINGS: Vent Mode:  [-] PRVC FiO2 (%):  [30 %] 30 % Set Rate:  [12 bmp] 12 bmp Vt Set:  [600 mL] 600 mL PEEP:  [5 cmH20] 5 cmH20 Plateau Pressure:  [17 cmH20-19 cmH20] 17 cmH20   INTAKE / OUTPUT:  Intake/Output Summary (Last 24 hours) at 11/21/14 1028 Last data filed at 11/21/14 0800  Gross per 24 hour  Intake 3979.62 ml  Output    520 ml  Net 3459.62 ml   PHYSICAL EXAMINATION: General:  81 year old male, unresponsive on vent.  Neuro:  Unresponsive, does not withdraw to pain. HEENT:  Orally intubated,  [place]/AT, PERRL, EOM-I. Cardiovascular:  Sinuts and bradycardic. Lungs:  Scattered rhonchi  Abdomen:  Soft, non-tender  Musculoskeletal:  Intact  Skin:  Scattered areas of ecchymosis   LABS:  CBC  Recent Labs Lab 11/18/14 0602 11/19/14 0600 11/20/14 0440  WBC 7.9 5.9 4.8  HGB 9.4* 8.6* 8.6*  HCT 28.1* 25.0* 25.6*  PLT 80* 75* 72*   Coag's  Recent Labs Lab 11-Dec-2014 0501  APTT 32  INR 1.26   BMET  Recent Labs Lab 11/18/14 0602 11/19/14 0600 11/20/14 0440  NA 141 144 146*  K 4.1 3.5 3.9  CL 112* 115* 119*  CO2 24 24 24   BUN 36* 34* 30*  CREATININE 1.41* 1.10 1.08  GLUCOSE 80 94 144*   Electrolytes  Recent Labs Lab 11/16/14 0520  11/18/14 0602 11/19/14 0600 11/20/14 0440  CALCIUM 8.8*  < > 7.8* 7.9* 8.0*  MG 1.9  --   --  2.0 2.0  PHOS  --   --   --  2.1* 2.0*  < > = values in this interval not displayed. Sepsis Markers No results for input(s): LATICACIDVEN, PROCALCITON, O2SATVEN in the last 168 hours. ABG  Recent Labs Lab 11/18/14 1233 11/19/14 0423 11/20/14 0610  PHART 7.539* 7.454* 7.454*  PCO2ART 29.6* 30.6* 28.8*  PO2ART 433.0* 142* 137*   Liver Enzymes  Recent Labs Lab 11/16/14 0520  AST 41  ALT 18  ALKPHOS 36*  BILITOT 1.0  ALBUMIN 2.5*   Cardiac Enzymes No results for input(s): TROPONINI, PROBNP in the last 168 hours. Glucose  Recent Labs Lab 11/20/14 1143 11/20/14 1606 11/20/14 1945 11/20/14 2312 11/21/14 0349 11/21/14 0810  GLUCAP 172* 164* 136* 119* 137* 104*   Imaging No results found. ASSESSMENT / PLAN:  PULMONARY OETT 6/21>>> A: Ventilator dependence in setting of inadequate airway protection  P:   Terminal extubation today.  CARDIOVASCULAR CVL A:  PAF on 6/19  A-fib with RVR this AM, normalized now on amiodarone. P:  D/C tele.  RENAL A:   AKI-->improving  P:   D/C further blood draws  GASTROINTESTINAL A:   Prob protein cal malnutrition  P:   D/C TF  HEMATOLOGIC A:   Anemia of  critical illness Thrombocytopenia  P:  D/C blood draws  INFECTIOUS A:   No acute  P:   No abx  ENDOCRINE A:   No acute  P:   D/C CBGs  NEUROLOGIC A:   Embolic CVA Probable seizure  COMA P:   Morphine for comfort.  FAMILY  - Updates: Wife updated bedside, discussed plan of care, they do not wish for him to linger this way, will start morphine in AM and terminally extubate.  - Inter-disciplinary family meet or Palliative Care meeting due by:  6/28  TODAY'S SUMMARY: massive embolic CVA s/p AF w/ RVR on 61WE. Now intubated. Made DNR. We will support for now but need to transition to comfort.  Discussed with Dr. Pearlean Brownie, no reasonable chance of recovery here.  Terminal extubation today.  The patient is critically ill with multiple organ systems failure and requires high complexity decision making for assessment and support, frequent evaluation and  titration of therapies, application of advanced monitoring technologies and extensive interpretation of multiple databases.   Critical Care Time devoted to patient care services described in this note is  35  Minutes. This time reflects time of care of this signee Dr Jennet Maduro. This critical care time does not reflect procedure time, or teaching time or supervisory time of PA/NP/Med student/Med Resident etc but could involve care discussion time.  Rush Farmer, M.D. [facility] Pulmonary/Critical Care Medicine. Pager: [phone]. After hours pager: [phone].

## 2014-11-21 NOTE — Progress Notes (Signed)
Pt respirations 3-3 per minute. Family has gone home. Pts grandson and daughter in law have been notified that the pt is progressing in the dying process. Pt appears in no distress.

## 2014-11-21 NOTE — Clinical Social Work Note (Signed)
Clinical Social Worker received referral for comfort care support for family who is withdrawing support from patient.  CSW has spoken with RN  regarding patient family.  Patient family has already built rapport/relationship with RN who will continue to follow patient family through withdraw process.  Patient family coping well and are appropriately grieving the loss of patient.  RN to notify CSW if further needs arise following patient death.  CSW remains available for outside support to family and staff as needed.  Macario Golds, [place] [phone]

## 2014-11-21 NOTE — Progress Notes (Signed)
STROKE TEAM PROGRESS NOTE   HISTORY Bruce Schultz is an 81 y.o. male hx of HTN, HLD, depression admitted on 6/18 after suffering a fall at home with resultant R femoral neck fracture. Had successful surgical repair on 6/18 with no complications. Post procedure was doing well. LSW 0130 on 6/19. Around 0430 noted to be unresponsive with left gaze deviation, would withdrawal symmetrically to noxious stimuli. Shortly after RN noted facial twitching which progressed to involve his head turned to the left with right arm extended and repetitive flexion of LUE. Episode lasted around 30 seconds to 1 minute. Eyes midline after episode but he remains unresponsive. He received ativan  x 1 and loaded with keppra  x 1. Seen by neurology. Following day seizure activity has slowed the patient remained unresponsive. Continuous EEG monitoring started an MRI ordered. MRI showed small bilateral cortical and subcortical infarcts. Patient was transferred to the stroke service for follow-up.   SUBJECTIVE (INTERVAL HISTORY) Patient has remained unresponsive despite not being on sedation. Patient's wife and daughter  and other family members are at the bedside and they agreed on DO NOT RESUSCITATE and will consider withdrawal of care today OBJECTIVE Temp:  [97.9 F (36.6 C)-98.8 F (37.1 C)] 98.1 F (36.7 C) (06/24 0813) Pulse Rate:  [50-86] 82 (06/24 1100) Cardiac Rhythm:  [-] Normal sinus rhythm (06/24 0800) Resp:  [12-34] 18 (06/24 1100) BP: (97-172)/(44-82) 115/75 mmHg (06/24 1000) SpO2:  [100 %] 100 % (06/24 1100) FiO2 (%):  [30 %] 30 % (06/24 0841) Weight:  [164 lb 3.9 oz (74.5 kg)] 164 lb 3.9 oz (74.5 kg) (06/24 0500)   Recent Labs Lab 11/20/14 1606 11/20/14 1945 11/20/14 2312 11/21/14 0349 11/21/14 0810  GLUCAP 164* 136* 119* 137* 104*    Recent Labs Lab 11/16/14 0520 11/17/14 0510 11/18/14 0602 11/19/14 0600 11/20/14 0440  NA 139 135 141 144 146*  K 4.3 4.6 4.1 3.5 3.9  CL 109  107 112* 115* 119*  CO2 11* GLUCOSE 208* 104* 80 94 144*  BUN 24* 41* 36* 34* 30*  CREATININE 1.65* 1.93* 1.41* 1.10 1.08  CALCIUM 8.8* 7.6* 7.8* 7.9* 8.0*  MG 1.9  --   --  2.0 2.0  PHOS  --   --   --  2.1* 2.0*    Recent Labs Lab 11/16/14 0520  AST 41  ALT 18  ALKPHOS 36*  BILITOT 1.0  PROT 5.5*  ALBUMIN 2.5*    Recent Labs Lab 11/11/2014 0501  11/16/14 0520 11/17/14 0510 11/18/14 0602 11/19/14 0600 11/20/14 0440  WBC 6.8  --  12.0* 9.2 7.9 5.9 4.8  NEUTROABS 5.0  --   --   --   --   --   --   HGB 12.3*  < > 12.8* 9.9* 9.4* 8.6* 8.6*  HCT 37.3*  < > 38.5* 29.0* 28.1* 25.0* 25.6*  MCV 88.2  --  89.1 87.9 88.4 86.8 87.7  PLT 136*  --  128* 101* 80* 75* 72*  < > = values in this interval not displayed. No results for input(s): CKTOTAL, CKMB, CKMBINDEX, TROPONINI in the last 168 hours. No results for input(s): LABPROT, INR in the last 72 hours. No results for input(s): COLORURINE, LABSPEC, PHURINE, GLUCOSEU, HGBUR, BILIRUBINUR, KETONESUR, PROTEINUR, UROBILINOGEN, NITRITE, LEUKOCYTESUR in the last 72 hours.  Invalid input(s): APPERANCEUR     Component Value Date/Time   CHOL 132 11/17/2014 1620   TRIG 92 11/17/2014 1620   HDL 34* 11/17/2014  1620   CHOLHDL 3.9 11/17/2014 1620   VLDL 18 11/17/2014 1620   LDLCALC 80 11/17/2014 1620   Lab Results  Component Value Date   HGBA1C 5.8* 11/17/2014   No results found for: LABOPIA, COCAINSCRNUR, LABBENZ, AMPHETMU, THCU, LABBARB  No results for input(s): ETH in the last 168 hours.  Dg Chest Port 1 View  11/20/2014   CLINICAL DATA:  Check endotracheal tube placement  EXAM: PORTABLE CHEST - 1 VIEW  COMPARISON:  11/19/2014  FINDINGS: Cardiac shadow is within normal limits. A left jugular central line, endotracheal tube and nasogastric catheter are again identified in stable position. The lungs are well aerated bilaterally. No bony abnormality is noted.  IMPRESSION: Tubes and lines as described.  No acute abnormality  noted.   Electronically Signed   By: Alcide Clever M.D.   On: 11/20/2014 07:53   2-D echocardiogram - Procedure narrative: Transthoracic echocardiography. Imagequality was suboptimal. The study was technically difficult, as aresult of poor acoustic windows, poor sound wave transmission,chest wall deformity, and body habitus. - Left ventricle: Can not comment on function based on currentimages.  EEG 11/19/2014 : This is an abnormal electroencephalogram secondary to general background slowing and intermittent triphasic waves. This finding is most consistent with an encephalopathy of which the etiology is nonspecific, but often associated with hepatic disease. There were multiple episodes of foot jerking during the recording without electroencephalogram correlate  PHYSICAL EXAM Frail elderly male not sedated . intubated. . Afebrile. Head is nontraumatic. Neck is supple without bruit.    Cardiac exam no murmur or gallop. Lungs are clear to auscultation. Distal pulses are well felt. Neurological Exam   patient  is intubated. Eyes are closed.  Pupils small irregular sluggishly reactive. Left eye corneal opacity. Fundi could not be visualized. Tongue midline. Doll`s eye movements are sluggish. Weak corneals , cough and gag. Minimal motor response to noxious stimuli in all 4 limbs legss. legs more than arm. ASSESSMENT/PLAN Bruce Schultz is a 81 y.o. male with history of chronic knee pain who tripped and fell presenting with right femoral neck fracture status post right total hip arthroplasty 6/18 he developed unresponsiveness and left gaze deviation followed by left upper body seizure. MRI showed bilateral small infarcts.  He did not receive IV t-PA due to nonfocal presentation.  Small bilateral infarts, felt to be cardioembolic versus embolic from fat emboli post hip fracture/fall  MRI  Small bilateral infarcts. small vessel disease   CTA head and neck no large vessel stenosis or  occlusion  2D Echo  Study of poor quality. LV not seen well   LDL 80  HgbA1c 5.8  SCDs for VTE prophylaxis Diet NPO time specified  aspirin 81 mg orally every day prior to admission, now on aspirin 300 mg suppository daily  Ongoing aggressive stroke risk factor management  Therapy recommendations:  pending   Disposition:  pending   Seizures  On Depacon 500 mg to 12 and Keppra 500 every 12  EEG short activity versus epileptiform activity  Valproic acid level 34   Hypertension  Stable  Hyperlipidemia  Home meds:  No statin   LDL 80, goal < 70  Add statin  Continue statin at discharge  Other Stroke Risk Factors  Advanced age  Other Active Problems  Questionable paroxysmal atrial fibrillation, now bradycardic  Ventilator dependence in setting of inadequate airway protection  Acute kidney injury  Patient made DO NOT RESUSCITATE today  Hospital day # 7  SETHI,PRAMOD  [facility]  Stroke Center See Amion for Pager information 11/21/2014 1:20 PM  I have personally examined this patient, reviewed notes, independently viewed imaging studies, participated in medical decision making and plan of care. I have made any additions or clarifications directly to the above note. Marland Kitchen He developed mental status changes post hip arthroplasty with MRI showing multiple tiny bilateral scattered DWI positive lesions -fat emboli versus embolic strokes.   Patient's prognosis remains poor given his neurological exam and prior baseline dementia. I had a long discussion at the bedside with the patient's wife and daughter and explained this and answered questions.  Family agrees now to withdrawal of care . D/w Dr Molli Knock This patient is critically ill and at significant risk of neurological worsening, death and care requires constant monitoring of vital signs, hemodynamics,respiratory and cardiac monitoring, extensive review of multiple databases, frequent neurological assessment, discussion  with family, other specialists and medical decision making of high complexity.I have made any additions or clarifications directly to the above note.  I spent 32 minutes of neurocritical care time  in the care of  this patient  Delia Heady, MD Medical Director [facility] Pager: [phone] 11/21/2014 1:20 PM    To contact Stroke Continuity provider, please refer to [email or web address]. After hours, contact General Neurology

## 2014-11-22 NOTE — Progress Notes (Signed)
Orthopedic Tech Progress Note Patient Details:  Bruce Schultz 1926/02/12 [id number]  Patient ID: [name], male   DOB: June 17, 1925, 81 y.o.   MRN: [id number] Pt unable to use trapeze bar patient helper  Nikki Dom 11/22/2014, 7:34 AM

## 2014-11-22 NOTE — Progress Notes (Signed)
PULMONARY / CRITICAL CARE MEDICINE   Name: Bruce Schultz MRN: [id number] DOB: [birth year]    ADMISSION DATE:  11/14/2014 CONSULTATION DATE:  6/21  REFERRING MD :  Catha Gosselin   CHIEF COMPLAINT:  Acute respiratory failure   INITIAL PRESENTATION:   81 year old male admitted 6/18 for repair of right hip fracture. Surgery was unremarkable. On the following day he was found unresponsive w/ clinical evidence of seizure and noted on central tele to be in AF w/ RVR HR 150s. Remained unresponsive w/ on-going neuro work-up suggesting embolic CVA. Transferred to the ICU on 6/21 after becoming bradycardic and no longer protecting airway.   STUDIES:  EEG 6/20: abnormal prolonged EEG monitoring due to the presence of general background slowing consistent with a diffuse disturbance that is etiologically nonspecific but may include a metabolic encephalopathy among other possibilities. Left gaze preference was not asociated with ictal activities.  Renal US 6/20: negative for hydro  MR brain 6/20: 1. Numerous punctate foci of restricted diffusion throughout the brain, with superior MCA territory involvement predominating. Appearance is most suggestive of widespread lacunar infarcts as the sequelae of cardiac or proximal aortic emboli.2. No associated mass effect or acute hemorrhage. 3. Areas of chronic hemorrhage and encephalomalacia in the right hemisphere and moderate for age signal changes suggesting chronic small vessel disease.  SIGNIFICANT EVENTS:   HISTORY OF PRESENT ILLNESS:   81 year old male admitted on 11/19/2014 as a transfer from [facility] with right hip fracture after falling. Past surgical history includes hypertension, hyperlipidemia as well as depression. He underwent right THR on day of admission. On 6/19 he was found in SVT w/ HR 90s and he was unresponsive w/ seizure-like activity. STAT CT head was obtained. Neuro had seen and felt he had had new seizure. F/u EEG obtained later that  day did not show epileptic activity. Remained unresponsive. MRI was performed on 11/17/14 which showed numerous punctate foci of her strict in diffusion throughout the brain with superior MCA territory involvement predominating. Appearance is most suggestive of widespread lacunar infarcts as the sequela of cardiac or proximal aortic emboli. No associated new hemorrhage was noted. On 11/18/14 at 917 AM nursing progress note noted pulse rate of 42 bpm with blood pressure 132/41 and that patient was unresponsive to sternal rub. A rapid response was called. Stroke team was notified. He was transferred to 3M07 intensive care. PCCM asked to assume care.    We are now being asked to see him secondary to possible atrial fibrillation. Upon close inspection of EKG on 11/18/14 at 10:07 AM, there appears to be low-voltage P waves in lead 2 preceding QRS complexes with premature junctional beats. Does not appear to be atrial fibrillation. Heart rate 47 bpm.  Subjective: no events overnight, remains completely unresponsive.  VITAL SIGNS: Pulse Rate:  [33-120] 34 (06/24 2200) Resp:  [0-35] 15 (06/25 0700) SpO2:  [96 %-100 %] 100 % (06/24 2200)   HEMODYNAMICS:     VENTILATOR SETTINGS:     INTAKE / OUTPUT:  Intake/Output Summary (Last 24 hours) at 11/22/14 1041 Last data filed at 11/22/14 0900  Gross per 24 hour  Intake 376.33 ml  Output     35 ml  Net 341.33 ml   PHYSICAL EXAMINATION: General:  81 year old male, unresponsive.  Neuro:  Unresponsive, does not withdraw to pain. HEENT:  [place]/AT, PERRL, EOM-I. Cardiovascular:  Sinuts and bradycardic. Lungs:  Scattered rhonchi  Abdomen:  Soft, non-tender  Musculoskeletal:  Intact  Skin:  Scattered  areas of ecchymosis   LABS:  CBC  Recent Labs Lab 11/18/14 0602 11/19/14 0600 11/20/14 0440  WBC 7.9 5.9 4.8  HGB 9.4* 8.6* 8.6*  HCT 28.1* 25.0* 25.6*  PLT 80* 75* 72*   Coag's No results for input(s): APTT, INR in the last 168  hours. BMET  Recent Labs Lab 11/18/14 0602 11/19/14 0600 11/20/14 0440  NA 141 144 146*  K 4.1 3.5 3.9  CL 112* 115* 119*  CO2 24 24 24   BUN 36* 34* 30*  CREATININE 1.41* 1.10 1.08  GLUCOSE 80 94 144*   Electrolytes  Recent Labs Lab 11/16/14 0520  11/18/14 0602 11/19/14 0600 11/20/14 0440  CALCIUM 8.8*  < > 7.8* 7.9* 8.0*  MG 1.9  --   --  2.0 2.0  PHOS  --   --   --  2.1* 2.0*  < > = values in this interval not displayed. Sepsis Markers No results for input(s): LATICACIDVEN, PROCALCITON, O2SATVEN in the last 168 hours. ABG  Recent Labs Lab 11/18/14 1233 11/19/14 0423 11/20/14 0610  PHART 7.539* 7.454* 7.454*  PCO2ART 29.6* 30.6* 28.8*  PO2ART 433.0* 142* 137*   Liver Enzymes  Recent Labs Lab 11/16/14 0520  AST 41  ALT 18  ALKPHOS 36*  BILITOT 1.0  ALBUMIN 2.5*   Cardiac Enzymes No results for input(s): TROPONINI, PROBNP in the last 168 hours. Glucose  Recent Labs Lab 11/20/14 1143 11/20/14 1606 11/20/14 1945 11/20/14 2312 11/21/14 0349 11/21/14 0810  GLUCAP 172* 164* 136* 119* 137* 104*   Imaging I reviewed CXR myself, no evidence of acute disease.  Discussed with TRH MD.  ASSESSMENT / PLAN:  PULMONARY OETT 6/21>>> A: Ventilator dependence in setting of inadequate airway protection  P:   D/C O2. Comfort measures only. Transfer to palliative care floor and to [facility].  CARDIOVASCULAR CVL A:  PAF on 6/19  A-fib with RVR this AM, normalized now on amiodarone. P:  D/C tele.  RENAL A:   AKI-->improving  P:   D/C further blood draws  GASTROINTESTINAL A:   Prob protein cal malnutrition  P:   D/C TF  HEMATOLOGIC A:   Anemia of critical illness Thrombocytopenia  P:  D/C blood draws  INFECTIOUS A:   No acute  P:   No abx  ENDOCRINE A:   No acute  P:   D/C CBGs  NEUROLOGIC A:   Embolic CVA Probable seizure  COMA P:   Morphine for comfort.  FAMILY  - Updates: Wife updated bedside, discussed plan of  care, they do not wish for him to linger this way, will start morphine in AM and terminally extubate.  - Inter-disciplinary family meet or Palliative Care meeting due by:  6/28  TODAY'S SUMMARY: massive embolic CVA s/p AF w/ RVR on 24QA. Now intubated. Made DNR. We will support for now but need to transition to comfort.  Discussed with Dr. Pearlean Brownie, no reasonable chance of recovery here.  Terminal extubation done, transfer to palliative care floor and to [facility] with PCCM off.  Family questions answered.  Alyson Reedy, M.D. [facility] Pulmonary/Critical Care Medicine. Pager: [phone]. After hours pager: [phone].

## 2014-11-22 NOTE — Progress Notes (Signed)
The Stroke team will sign off at this time. Please call if we can be of further service.  Delton See PA-C [facility] Pager [phone] 11/22/2014, 10:47 AM

## 2014-11-22 NOTE — Progress Notes (Signed)
Received into 6N32 at 1745 for comfort care, unresponsive, settled in room, gown change due to dampness, foley intact, IV infusing KVO.  Family oriented to unit, declined comfort cart.

## 2014-11-23 ENCOUNTER — Encounter (HOSPITAL_COMMUNITY): Payer: Self-pay

## 2014-11-23 DIAGNOSIS — G9340 Encephalopathy, unspecified: Secondary | ICD-10-CM

## 2014-11-28 NOTE — Progress Notes (Signed)
Witnessed waste of morphine drip 30cc into sink.  Pt no heart sounds.

## 2014-11-28 NOTE — Discharge Summary (Signed)
[facility] PROGRESS NOTE  Bruce Schultz VOP:[id number] DOB: Feb 10, 1926 DOA: 11/14/2014 PCP: Colon Branch, MD  DEATH diagnosis     Principal Problem:   Closed right hip fracture Active Problems:   Hypertension   Fall   Anemia   Bradycardia, sinus   Femoral neck fracture   Displaced fracture of right femoral neck   Seizure   Cerebral embolism with cerebral infarction   Acute encephalopathy   Acute respiratory failure with hypoxemia   AKI (acute kidney injury)    81 year old male admitted on 2014/11/25 as a transfer from [facility] with right hip fracture after falling. Past surgical history includes hypertension, hyperlipidemia as well as depression. He underwent right THR on day of admission. On 6/19 he was found in SVT w/ HR 90s and he was unresponsive w/ seizure-like activity. STAT CT head was obtained. Neuro had seen and felt he had had new seizure. F/u EEG obtained later that day did not show epileptic activity. Remained unresponsive. MRI was performed on 11/17/14 which showed numerous punctate foci of her strict in diffusion throughout the brain with superior MCA territory involvement predominating. Appearance is most suggestive of widespread lacunar infarcts as the sequela of cardiac or proximal aortic emboli. No associated new hemorrhage was noted. On 11/18/14 at 917 AM nursing progress note noted pulse rate of 42 bpm with blood pressure 132/41 and that patient was unresponsive to sternal rub. A rapid response was called. Stroke team was notified. He was transferred to 3M07 intensive care. PCCM asked to assume care initially, Transferred to [facility] 6/26    Plan #1 status post ventilatory dependence secondary to inadequate airway protection #2 atrial fibrillation, now off amiodarone #3 acute kidney injury #4 severe protein calorie malnutrition #5 anemia of critical illness/thrombocytopenia #6 Small bilateral infarts, felt to be cardioembolic versus embolic from  fat emboli post hip fracture/fall, coma   Massive embolic CVA status post atrial fibrillation CONTINUE COMFORT CARE MEASURES  Time of death ;14:30   Code Status:      Code Status Orders    DO NOT RESUSCITATE    Start     Ordered   Family Communication: family updated about patient's clinical progress Disposition Plan:  As above    Consultants:  Critical care  Neurology    Procedures: STUDIES:  EEG 6/20: abnormal prolonged EEG monitoring due to the presence of general background slowing consistent with a diffuse disturbance that is etiologically nonspecific but may include a metabolic encephalopathy among other possibilities. Left gaze preference was not asociated with ictal activities.  Renal US 6/20: negative for hydro  MR brain 6/20: 1. Numerous punctate foci of restricted diffusion throughout the brain, with superior MCA territory involvement predominating. Appearance is most suggestive of widespread lacunar infarcts as the sequelae of cardiac or proximal aortic emboli.2. No associated mass effect or acute hemorrhage. 3. Areas of chronic hemorrhage and encephalomalacia in the right hemisphere and moderate for age signal changes suggesting chronic small vessel disease  Antibiotics: Anti-infectives    Start     Dose/Rate Route Frequency Ordered Stop   11/16/14 0600  ceFAZolin (ANCEF) IVPB 2 g/50 mL premix     2 g 100 mL/hr over 30 Minutes Intravenous On call to O.R. 11-25-14 1327 11/16/14 0715   11/16/14 0000  ceFAZolin (ANCEF) IVPB 2 g/50 mL premix     2 g 100 mL/hr over 30 Minutes Intravenous Every 6 hours November 25, 2014 2232 11/16/14 0718   25-Nov-2014 1345  ceFAZolin (  ANCEF) IVPB 2 g/50 mL premix  Status:  Discontinued     2 g 100 mL/hr over 30 Minutes Intravenous To Surgery 11-17-2014 0804 11-17-14 2232         HPI/Subjective: Unresponsive  Objective: Filed Vitals:   11/22/14 0600 11/22/14 0700 11/22/14 1745 11/13/2014 0558  BP:    77/33  Pulse:    84  Temp:       TempSrc:      Resp: 23 15 12 14   Height:      Weight:      SpO2:    84%    Intake/Output Summary (Last 24 hours) at 11/13/2014 1656 Last data filed at 11/22/14 1710  Gross per 24 hour  Intake    110 ml  Output     25 ml  Net     85 ml    Exam: General: 81 year old male, unresponsive.  Neuro: Unresponsive, does not withdraw to pain. HEENT: [place]/AT, PERRL, EOM-I. Cardiovascular: Sinuts and bradycardic. Lungs: Scattered rhonchi  Abdomen: Soft, non-tender  Musculoskeletal: Intact  Skin: Scattered areas of ecchymosis      Data Review   Micro Results Recent Results (from the past 240 hour(s))  Surgical pcr screen     Status: Abnormal   Collection Time: 2014/11/17  1:32 PM  Result Value Ref Range Status   MRSA, PCR NEGATIVE NEGATIVE Final   Staphylococcus aureus POSITIVE (A) NEGATIVE Final    Comment:        The Xpert SA Assay (FDA approved for NASAL specimens in patients over 15 years of age), is one component of a comprehensive surveillance program.  Test performance has been validated by [facility] for patients greater than or equal to 20 year old. It is not intended to diagnose infection nor to guide or monitor treatment.     Radiology Reports Ct Angio Head W/cm &/or Wo Cm  11/18/2014   CLINICAL DATA:  Evaluate multiple BILATERAL acute infarcts. Recent surgical repair of femoral neck fracture 11/17/14.  EXAM: CT ANGIOGRAPHY HEAD AND NECK  TECHNIQUE: Multidetector CT imaging of the head and neck was performed using the standard protocol during bolus administration of intravenous contrast. Multiplanar CT image reconstructions and MIPs were obtained to evaluate the vascular anatomy. Carotid stenosis measurements (when applicable) are obtained utilizing NASCET criteria, using the distal internal carotid diameter as the denominator.  CONTRAST:  60mL OMNIPAQUE IOHEXOL 350 MG/ML SOLN  COMPARISON:  MRI brain 11/17/2014.  FINDINGS: The patient was unable to  remain motionless for the exam. Small or subtle lesions could be overlooked.  CT HEAD  Calvarium and skull base: No fracture or destructive lesion. Mastoids and middle ears are grossly clear.  Paranasal sinuses: Imaged portions are clear.  Orbits: Negative.  Brain: No visible acute infarct; the areas of acute infarction are much better visualized on MR. No hemorrhage, hydrocephalus, mass lesion, or extra-axial fluid.  Remote RIGHT frontal infarction is stable. Advanced chronic microvascular ischemic change throughout the subcortical and periventricular white matter.  CTA NECK  Aortic arch: Standard branching. Imaged portion shows no evidence of aneurysm or dissection. Major arch vessel origins incompletely visualized  Right carotid system: Mild calcific plaque. No evidence of dissection, stenosis (50% or greater) or occlusion.  Left carotid system: Mild calcific plaque. No evidence of dissection, stenosis (50% or greater) or occlusion.  Vertebral arteries: RIGHT vertebral dominant. LEFT vertebral severely diseased throughout its course. No evidence of dissection, stenosis (50% or greater) or occlusion.  Nonvascular structures: Cervical spondylosis.  Ossification posterior longitudinal ligament. Patient is intubated. Mild fluid in the LEFT major fissure. No pneumothorax. No lung nodule. No neck masses.  CTA HEAD  Anterior circulation: Mild non stenotic cavernous and supraclinoid plaque. No significant stenosis, proximal occlusion, aneurysm, or vascular malformation.  Posterior circulation: RIGHT vertebral is the dominant/sole contributor to the basilar; LEFT vertebral supplies mainly the inferior cerebellum. No significant stenosis, proximal occlusion, aneurysm, or vascular malformation.  Venous sinuses: As permitted by contrast timing, patent.  Anatomic variants: BILATERAL fetal PCA origins contribute to slight basilar hypoplasia  Delayed phase:   No abnormal intracranial enhancement.  IMPRESSION: No significant  extracranial or intracranial flow reducing lesion is evident. The observed pattern of cerebral ischemia on MR is likely due to cardiac emboli, or potentially watershed phenomenon.   Electronically Signed   By: Elsie Stain M.D.   On: 11/18/2014 19:00   Ct Head Wo Contrast  11/16/2014   CLINICAL DATA:  Seizures.  EXAM: CT HEAD WITHOUT CONTRAST  TECHNIQUE: Contiguous axial images were obtained from the base of the skull through the vertex without intravenous contrast.  COMPARISON:  11/14/2014  FINDINGS: Examination is technically limited due to motion artifact. Mild diffuse cerebral atrophy. Low-attenuation changes diffusely throughout the white matter consistent with small vessel ischemia. 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 No mass effect or midline shift. No abnormal extra-axial fluid collections. Gray-white matter junctions are distinct. Basal cisterns are not effaced. No evidence of acute intracranial hemorrhage. No depressed skull fractures. Visualized paranasal sinuses and mastoid air cells are not opacified.  IMPRESSION: No acute intracranial abnormalities. Chronic atrophy and small vessel ischemic changes. 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 No change since prior study.   Electronically Signed   By: Burman Nieves M.D.   On: 11/16/2014 06:00   Ct Angio Neck W/cm &/or Wo/cm  11/18/2014   CLINICAL DATA:  Evaluate multiple BILATERAL acute infarcts. Recent surgical repair of femoral neck fracture 2014-11-18.  EXAM: CT ANGIOGRAPHY HEAD AND NECK  TECHNIQUE: Multidetector CT imaging of the head and neck was performed using the standard protocol during bolus administration of intravenous contrast. Multiplanar CT image reconstructions and MIPs were obtained to evaluate the vascular anatomy. Carotid stenosis measurements (when applicable) are obtained utilizing NASCET criteria, using the distal internal carotid  diameter as the denominator.  CONTRAST:  60mL OMNIPAQUE IOHEXOL 350 MG/ML SOLN  COMPARISON:  MRI brain 11/17/2014.  FINDINGS: The patient was unable to remain motionless for the exam. Small or subtle lesions could be overlooked.  CT HEAD  Calvarium and skull base: No fracture or destructive lesion. Mastoids and middle ears are grossly clear.  Paranasal sinuses: Imaged portions are clear.  Orbits: Negative.  Brain: No visible acute infarct; the areas of acute infarction are much better visualized on MR. No hemorrhage, hydrocephalus, mass lesion, or extra-axial fluid.  Remote RIGHT frontal infarction is stable. Advanced chronic microvascular ischemic change throughout the subcortical and periventricular white matter.  CTA NECK  Aortic arch: Standard branching. Imaged portion shows no evidence of aneurysm or dissection. Major arch vessel origins incompletely visualized  Right carotid system: Mild calcific plaque. No evidence of dissection, stenosis (50% or greater) or occlusion.  Left carotid system: Mild calcific plaque. No evidence of dissection, stenosis (50% or greater) or occlusion.  Vertebral arteries: RIGHT vertebral dominant. LEFT vertebral severely diseased throughout its course. No evidence of dissection, stenosis (50% or greater) or occlusion.  Nonvascular structures: Cervical spondylosis. Ossification  posterior longitudinal ligament. Patient is intubated. Mild fluid in the LEFT major fissure. No pneumothorax. No lung nodule. No neck masses.  CTA HEAD  Anterior circulation: Mild non stenotic cavernous and supraclinoid plaque. No significant stenosis, proximal occlusion, aneurysm, or vascular malformation.  Posterior circulation: RIGHT vertebral is the dominant/sole contributor to the basilar; LEFT vertebral supplies mainly the inferior cerebellum. No significant stenosis, proximal occlusion, aneurysm, or vascular malformation.  Venous sinuses: As permitted by contrast timing, patent.  Anatomic variants:  BILATERAL fetal PCA origins contribute to slight basilar hypoplasia  Delayed phase:   No abnormal intracranial enhancement.  IMPRESSION: No significant extracranial or intracranial flow reducing lesion is evident. The observed pattern of cerebral ischemia on MR is likely due to cardiac emboli, or potentially watershed phenomenon.   Electronically Signed   By: Elsie Stain M.D.   On: 11/18/2014 19:00   Mr Brain Wo Contrast  11/17/2014   CLINICAL DATA:  81 year old male with acute onset altered mental status, suspected new onset seizure of unclear etiology. Unresponsive. Initial encounter.  EXAM: MRI HEAD WITHOUT CONTRAST  TECHNIQUE: Multiplanar, multiecho pulse sequences of the brain and surrounding structures were obtained without intravenous contrast.  COMPARISON:  Head CTs without contrast 10/1914 and 11/14/2014  FINDINGS: There are numerous small foci of restricted diffusion scattered in both cerebral hemispheres, primarily the MCA territories along the superior aspect of both hemispheres. There is some bilateral deep gray matter nuclei involvement, primarily both thalami. There is PCA territory involvement bilaterally. The brainstem is relatively spared. There is occasional right cerebellar hemisphere involvement.  No associated acute hemorrhage or mass effect.  There is a 3-4 cm area of chronic hemorrhage in encephalomalacia in the medial right inferior frontal lobe. There is Patchy and confluent bilateral cerebral white matter T2 and FLAIR hyperintensity. Smaller focus of chronic hemorrhage in the posterior right operculum. Occasional chronic micro hemorrhages elsewhere. T2 heterogeneity in the deep gray matter nuclei mostly resembles perivascular spaces. Occasional chronic lacunar infarcts in the cerebellum. Mild for age patchy T2 and FLAIR hyperintensity in the brainstem.  Major intracranial vascular flow voids are preserved, dominant appearing distal right vertebral artery  No definite signal  abnormality in the hippocampal formations or mesial temporal lobes. No intracranial mass effect. No ventriculomegaly. Negative pituitary. Cervicomedullary junction remarkable for degenerative ligamentous hypertrophy about the odontoid. Normal bone marrow signal.  Visible internal auditory structures appear normal. Mastoids are clear. Paranasal sinuses are clear. Negative orbit and scalp soft tissues.  IMPRESSION: 1. Numerous punctate foci of restricted diffusion throughout the brain, with superior MCA territory involvement predominating. Appearance is most suggestive of widespread lacunar infarcts as the sequelae of cardiac or proximal aortic emboli. 2. No associated mass effect or acute hemorrhage. 3. Areas of chronic hemorrhage and encephalomalacia in the right hemisphere and moderate for age signal changes suggesting chronic small vessel disease.   Electronically Signed   By: Odessa Fleming M.D.   On: 11/17/2014 13:06   US Renal  11/17/2014   CLINICAL DATA:  Acute kidney injury.  EXAM: RENAL / URINARY TRACT ULTRASOUND COMPLETE  COMPARISON:  None.  FINDINGS: Right Kidney:  Length: 8.3 cm. Right kidney is small. Negative for hydronephrosis. Echogenicity of the right kidney is within normal limits.  Left Kidney:  Length: 9.1 cm. Negative for left hydronephrosis. Echogenicity in the left kidney is within normal limits. Left kidney lower pole is not well visualized.  Bladder:  Urinary bladder is decompressed with a Foley catheter.  IMPRESSION: Negative for hydronephrosis.  Electronically Signed   By: Richarda Overlie M.D.   On: 11/17/2014 15:33   Pelvis Portable  11/11/2014   CLINICAL DATA:  81 year old male status post right hip surgery for proximal femur fracture. Initial encounter.  EXAM: PORTABLE PELVIS 1-2 VIEWS  COMPARISON:  11/14/2014.  FINDINGS: Portable supine AP views of the pelvis 2311 hrs. Right proximal femoral arthroplasty now in place with cerclage wires. Hardware appears intact. Alignment with the acetabulum  appears normal on this view. Surrounding postoperative changes to the soft tissues with subcutaneous gas. No unexpected osseous changes or new osseous abnormality identified.  IMPRESSION: Right hip arthroplasty with no adverse features.   Electronically Signed   By: Odessa Fleming M.D.   On: 11/02/2014 23:35   Dg Chest Port 1 View  11/20/2014   CLINICAL DATA:  Check endotracheal tube placement  EXAM: PORTABLE CHEST - 1 VIEW  COMPARISON:  11/19/2014  FINDINGS: Cardiac shadow is within normal limits. A left jugular central line, endotracheal tube and nasogastric catheter are again identified in stable position. The lungs are well aerated bilaterally. No bony abnormality is noted.  IMPRESSION: Tubes and lines as described.  No acute abnormality noted.   Electronically Signed   By: Alcide Clever M.D.   On: 11/20/2014 07:53   Dg Chest Port 1 View  11/19/2014   CLINICAL DATA:  Intubation, RIGHT hip fracture, respiratory failure, bradycardia, history hypertension, hyperlipidemia  EXAM: PORTABLE CHEST - 1 VIEW  COMPARISON:  Portable exam 0617 hours compared 07/06/2019 2016  FINDINGS: Tip of endotracheal tube projects 1.8 cm above carina.  LEFT jugular central venous catheter tip projects over SVC.  Normal heart size, mediastinal contours, and pulmonary vascularity.  Lungs clear.  No pleural effusion or pneumothorax.  Bones demineralized.  IMPRESSION: Satisfactory endotracheal tube position.  No acute abnormalities.   Electronically Signed   By: Ulyses Southward M.D.   On: 11/19/2014 08:07   Portable Chest Xray  11/18/2014   CLINICAL DATA:  Hypoxia  EXAM: PORTABLE CHEST - 1 VIEW  COMPARISON:  November 14, 2014  FINDINGS: Endotracheal tube tip is 2.9 cm above the carina. Central catheter tip is in the superior vena cava. There is no pneumothorax. There is no edema or consolidation. The heart size and pulmonary vascularity are normal. No adenopathy. No bone lesions.  IMPRESSION: Tube and catheter positions as described without  pneumothorax. No edema or consolidation.   Electronically Signed   By: Bretta Bang III M.D.   On: 11/18/2014 12:50   Dg Abd Portable 1v  11/19/2014   CLINICAL DATA:  Stroke.  OG tube placement  EXAM: PORTABLE ABDOMEN - 1 VIEW  COMPARISON:  None.  FINDINGS: Enteric tube tip is in the mid stomach. Mild gaseous distention of visualize colon. Visualized lung fields are clear.  IMPRESSION: Enteric tube tip in the mid stomach.   Electronically Signed   By: Charlett Nose M.D.   On: 11/19/2014 12:31   Dg Hip Unilat With Pelvis 1v Right  11/22/2014   CLINICAL DATA:  Right hip pain after a fall today.  EXAM: RIGHT HIP (WITH PELVIS) 1 VIEW  COMPARISON:  None.  FINDINGS: Transverse fracture through the right femoral neck with varus angulation of the fracture fragments. No dislocation of the hip joint. Degenerative changes in the lower lumbar spine and both hips. Pelvis appears intact. SI joints and symphysis pubis are not displaced.  IMPRESSION: Acute transverse fracture of the right hip with varus angulation.   Electronically Signed   By:  Burman Nieves M.D.   On: 11-19-14 00:04   Dg Hip Operative Unilat With Pelvis Right  2014/11/19   CLINICAL DATA:  Right hip replacement.  EXAM: OPERATIVE RIGHT HIP (WITH PELVIS IF PERFORMED) 3 VIEWS  TECHNIQUE: Fluoroscopic spot image(s) were submitted for interpretation post-operatively.  FLUOROSCOPY TIME:  Radiation Exposure Index (as provided by the fluoroscopic device): Not applicable  If the device does not provide the exposure index:  Fluoroscopy Time:  0 min 41 seconds  Number of Acquired Images:  3  COMPARISON:  Yesterday.  FINDINGS: Interval right hip prosthesis in satisfactory position and alignment on frontal views. No fracture or dislocation seen on these views.  IMPRESSION: Satisfactory postoperative appearance of a right hip prosthesis in the frontal projection.   Electronically Signed   By: Beckie Salts M.D.   On: Nov 19, 2014 21:41   Dg Femur, Min 2 Views  Right  2014-11-19   CLINICAL DATA:  81 year old male who fell today with right hip pain. Initial encounter.  EXAM: RIGHT FEMUR 2 VIEWS  COMPARISON:  [facility] right hip series 1815 hr 11/14/2014.  FINDINGS: Comminuted right femoral neck fracture with varus impaction re- demonstrated. Configuration is stable since 1815 hrs.  The more distal right femur appears intact. Severe tricompartmental degenerative changes at the right knee. Calcified atherosclerosis in the right lower extremity.  IMPRESSION: Mildly comminuted right femoral neck fracture with varus impaction is stable from earlier today.  No other right femur fracture identified.   Electronically Signed   By: Odessa Fleming M.D.   On: 11-19-14 00:07     CBC  Recent Labs Lab 11/17/14 0510 11/18/14 0602 11/19/14 0600 11/20/14 0440  WBC 9.2 7.9 5.9 4.8  HGB 9.9* 9.4* 8.6* 8.6*  HCT 29.0* 28.1* 25.0* 25.6*  PLT 101* 80* 75* 72*  MCV 87.9 88.4 86.8 87.7  MCH 30.9 29.6 29.9 29.5  MCHC 35.2 33.5 34.4 33.6  RDW 14.9 14.9 15.0 15.2    Chemistries   Recent Labs Lab 11/17/14 0510 11/18/14 0602 11/19/14 0600 11/20/14 0440  NA 135 141 144 146*  K 4.6 4.1 3.5 3.9  CL 107 112* 115* 119*  CO2 GLUCOSE 104* 80 94 144*  BUN 41* 36* 34* 30*  CREATININE 1.93* 1.41* 1.10 1.08  CALCIUM 7.6* 7.8* 7.9* 8.0*  MG  --   --  2.0 2.0    CBG:  Recent Labs Lab 11/20/14 1606 11/20/14 1945 11/20/14 2312 11/21/14 0349 11/21/14 0810  GLUCAP 164* 136* 119* 137* 104*       Studies: No results found.    Lab Results  Component Value Date   HGBA1C 5.8* 11/17/2014   Lab Results  Component Value Date   LDLCALC 80 11/17/2014   CREATININE 1.08 11/20/2014       Scheduled Meds: . antiseptic oral rinse  7 mL Mouth Rinse QID  . chlorhexidine  15 mL Mouth Rinse BID  . levETIRAcetam  500 mg Intravenous Q12H  . scopolamine  1 patch Transdermal Q72H   Continuous Infusions: . morphine 10 mg/hr (11/22/14 1317)     Principal Problem:   Closed right hip fracture Active Problems:   Hypertension   Fall   Anemia   Bradycardia, sinus   Femoral neck fracture   Displaced fracture of right femoral neck   Seizure   Cerebral embolism with cerebral infarction   Acute encephalopathy   Acute respiratory failure with hypoxemia   AKI (acute kidney injury)  Time spent: 45 minutes   [facility]  [facility] Pager [phone]. If 7PM-7AM, please contact night-coverage at [email or web address], password [facility] 2014-12-20, 4:56 PM  LOS: 9 days

## 2014-11-28 NOTE — Progress Notes (Signed)
[facility] PROGRESS NOTE  Bruce Schultz:[id number] DOB: [birth year] DOA: 11/14/2014 PCP: Colon Branch, MD  Assessment/Plan: Principal Problem:   Closed right hip fracture Active Problems:   Hypertension   Fall   Anemia   Bradycardia, sinus   Femoral neck fracture   Displaced fracture of right femoral neck   Seizure   Cerebral embolism with cerebral infarction   Acute encephalopathy   Acute respiratory failure with hypoxemia   AKI (acute kidney injury)    81 year old male admitted on 10/30/2014 as a transfer from [facility] with right hip fracture after falling. Past surgical history includes hypertension, hyperlipidemia as well as depression. He underwent right THR on day of admission. On 6/19 he was found in SVT w/ HR 90s and he was unresponsive w/ seizure-like activity. STAT CT head was obtained. Neuro had seen and felt he had had new seizure. F/u EEG obtained later that day did not show epileptic activity. Remained unresponsive. MRI was performed on 11/17/14 which showed numerous punctate foci of her strict in diffusion throughout the brain with superior MCA territory involvement predominating. Appearance is most suggestive of widespread lacunar infarcts as the sequela of cardiac or proximal aortic emboli. No associated new hemorrhage was noted. On 11/18/14 at 917 AM nursing progress note noted pulse rate of 42 bpm with blood pressure 132/41 and that patient was unresponsive to sternal rub. A rapid response was called. Stroke team was notified. He was transferred to 3M07 intensive care. PCCM asked to assume care initially, Transferred to [facility] 6/26    Plan #1 status post ventilatory dependence secondary to inadequate airway protection #2 atrial fibrillation, now off amiodarone #3 acute kidney injury #4 severe protein calorie malnutrition #5 anemia of critical illness/thrombocytopenia #6 embolic CVA, coma  Massive embolic CVA status post atrial  fibrillation CONTINUE COMFORT CARE MEASURES  Anticipate hospital death   Code Status:      Code Status Orders    DO NOT RESUSCITATE    Start     Ordered   Family Communication: family updated about patient's clinical progress Disposition Plan:  As above    Consultants:  Critical care  Neurology    Procedures: STUDIES:  EEG 6/20: abnormal prolonged EEG monitoring due to the presence of general background slowing consistent with a diffuse disturbance that is etiologically nonspecific but may include a metabolic encephalopathy among other possibilities. Left gaze preference was not asociated with ictal activities.  Renal US 6/20: negative for hydro  MR brain 6/20: 1. Numerous punctate foci of restricted diffusion throughout the brain, with superior MCA territory involvement predominating. Appearance is most suggestive of widespread lacunar infarcts as the sequelae of cardiac or proximal aortic emboli.2. No associated mass effect or acute hemorrhage. 3. Areas of chronic hemorrhage and encephalomalacia in the right hemisphere and moderate for age signal changes suggesting chronic small vessel disease  Antibiotics: Anti-infectives    Start     Dose/Rate Route Frequency Ordered Stop   11/16/14 0600  ceFAZolin (ANCEF) IVPB 2 g/50 mL premix     2 g 100 mL/hr over 30 Minutes Intravenous On call to O.R. 10/31/2014 1327 11/16/14 0715   11/16/14 0000  ceFAZolin (ANCEF) IVPB 2 g/50 mL premix     2 g 100 mL/hr over 30 Minutes Intravenous Every 6 hours 11/17/2014 2232 11/16/14 0718   11/13/2014 1345  ceFAZolin (ANCEF) IVPB 2 g/50 mL premix  Status:  Discontinued     2 g 100 mL/hr over 30  Minutes Intravenous To Surgery 2014-12-11 0804 December 11, 2014 2232         HPI/Subjective: Unresponsive  Objective: Filed Vitals:   11/22/14 0600 11/22/14 0700 11/22/14 1745 11/15/2014 0558  BP:    77/33  Pulse:    84  Temp:      TempSrc:      Resp: 23 15 12 14   Height:      Weight:      SpO2:     84%    Intake/Output Summary (Last 24 hours) at 11/16/2014 1154 Last data filed at 11/22/14 1710  Gross per 24 hour  Intake    160 ml  Output     75 ml  Net     85 ml    Exam: General: 81 year old male, unresponsive.  Neuro: Unresponsive, does not withdraw to pain. HEENT: [place]/AT, PERRL, EOM-I. Cardiovascular: Sinuts and bradycardic. Lungs: Scattered rhonchi  Abdomen: Soft, non-tender  Musculoskeletal: Intact  Skin: Scattered areas of ecchymosis      Data Review   Micro Results Recent Results (from the past 240 hour(s))  Surgical pcr screen     Status: Abnormal   Collection Time: 12-11-14  1:32 PM  Result Value Ref Range Status   MRSA, PCR NEGATIVE NEGATIVE Final   Staphylococcus aureus POSITIVE (A) NEGATIVE Final    Comment:        The Xpert SA Assay (FDA approved for NASAL specimens in patients over 10 years of age), is one component of a comprehensive surveillance program.  Test performance has been validated by [facility] for patients greater than or equal to 7 year old. It is not intended to diagnose infection nor to guide or monitor treatment.     Radiology Reports Ct Angio Head W/cm &/or Wo Cm  11/18/2014   CLINICAL DATA:  Evaluate multiple BILATERAL acute infarcts. Recent surgical repair of femoral neck fracture 12/11/2014.  EXAM: CT ANGIOGRAPHY HEAD AND NECK  TECHNIQUE: Multidetector CT imaging of the head and neck was performed using the standard protocol during bolus administration of intravenous contrast. Multiplanar CT image reconstructions and MIPs were obtained to evaluate the vascular anatomy. Carotid stenosis measurements (when applicable) are obtained utilizing NASCET criteria, using the distal internal carotid diameter as the denominator.  CONTRAST:  60mL OMNIPAQUE IOHEXOL 350 MG/ML SOLN  COMPARISON:  MRI brain 11/17/2014.  FINDINGS: The patient was unable to remain motionless for the exam. Small or subtle lesions could be overlooked.  CT  HEAD  Calvarium and skull base: No fracture or destructive lesion. Mastoids and middle ears are grossly clear.  Paranasal sinuses: Imaged portions are clear.  Orbits: Negative.  Brain: No visible acute infarct; the areas of acute infarction are much better visualized on MR. No hemorrhage, hydrocephalus, mass lesion, or extra-axial fluid.  Remote RIGHT frontal infarction is stable. Advanced chronic microvascular ischemic change throughout the subcortical and periventricular white matter.  CTA NECK  Aortic arch: Standard branching. Imaged portion shows no evidence of aneurysm or dissection. Major arch vessel origins incompletely visualized  Right carotid system: Mild calcific plaque. No evidence of dissection, stenosis (50% or greater) or occlusion.  Left carotid system: Mild calcific plaque. No evidence of dissection, stenosis (50% or greater) or occlusion.  Vertebral arteries: RIGHT vertebral dominant. LEFT vertebral severely diseased throughout its course. No evidence of dissection, stenosis (50% or greater) or occlusion.  Nonvascular structures: Cervical spondylosis. Ossification posterior longitudinal ligament. Patient is intubated. Mild fluid in the LEFT major fissure. No pneumothorax. No lung nodule. No  neck masses.  CTA HEAD  Anterior circulation: Mild non stenotic cavernous and supraclinoid plaque. No significant stenosis, proximal occlusion, aneurysm, or vascular malformation.  Posterior circulation: RIGHT vertebral is the dominant/sole contributor to the basilar; LEFT vertebral supplies mainly the inferior cerebellum. No significant stenosis, proximal occlusion, aneurysm, or vascular malformation.  Venous sinuses: As permitted by contrast timing, patent.  Anatomic variants: BILATERAL fetal PCA origins contribute to slight basilar hypoplasia  Delayed phase:   No abnormal intracranial enhancement.  IMPRESSION: No significant extracranial or intracranial flow reducing lesion is evident. The observed pattern of  cerebral ischemia on MR is likely due to cardiac emboli, or potentially watershed phenomenon.   Electronically Signed   By: Elsie Stain M.D.   On: 11/18/2014 19:00   Ct Head Wo Contrast  11/16/2014   CLINICAL DATA:  Seizures.  EXAM: CT HEAD WITHOUT CONTRAST  TECHNIQUE: Contiguous axial images were obtained from the base of the skull through the vertex without intravenous contrast.  COMPARISON:  11/14/2014  FINDINGS: Examination is technically limited due to motion artifact. Mild diffuse cerebral atrophy. Low-attenuation changes diffusely throughout the white matter consistent with small vessel ischemia. 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 No mass effect or midline shift. No abnormal extra-axial fluid collections. Gray-white matter junctions are distinct. Basal cisterns are not effaced. No evidence of acute intracranial hemorrhage. No depressed skull fractures. Visualized paranasal sinuses and mastoid air cells are not opacified.  IMPRESSION: No acute intracranial abnormalities. Chronic atrophy and small vessel ischemic changes. 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 No change since prior study.   Electronically Signed   By: Burman Nieves M.D.   On: 11/16/2014 06:00   Ct Angio Neck W/cm &/or Wo/cm  11/18/2014   CLINICAL DATA:  Evaluate multiple BILATERAL acute infarcts. Recent surgical repair of femoral neck fracture Nov 21, 2014.  EXAM: CT ANGIOGRAPHY HEAD AND NECK  TECHNIQUE: Multidetector CT imaging of the head and neck was performed using the standard protocol during bolus administration of intravenous contrast. Multiplanar CT image reconstructions and MIPs were obtained to evaluate the vascular anatomy. Carotid stenosis measurements (when applicable) are obtained utilizing NASCET criteria, using the distal internal carotid diameter as the denominator.  CONTRAST:  3mL OMNIPAQUE IOHEXOL 350 MG/ML SOLN  COMPARISON:   MRI brain 11/17/2014.  FINDINGS: The patient was unable to remain motionless for the exam. Small or subtle lesions could be overlooked.  CT HEAD  Calvarium and skull base: No fracture or destructive lesion. Mastoids and middle ears are grossly clear.  Paranasal sinuses: Imaged portions are clear.  Orbits: Negative.  Brain: No visible acute infarct; the areas of acute infarction are much better visualized on MR. No hemorrhage, hydrocephalus, mass lesion, or extra-axial fluid.  Remote RIGHT frontal infarction is stable. Advanced chronic microvascular ischemic change throughout the subcortical and periventricular white matter.  CTA NECK  Aortic arch: Standard branching. Imaged portion shows no evidence of aneurysm or dissection. Major arch vessel origins incompletely visualized  Right carotid system: Mild calcific plaque. No evidence of dissection, stenosis (50% or greater) or occlusion.  Left carotid system: Mild calcific plaque. No evidence of dissection, stenosis (50% or greater) or occlusion.  Vertebral arteries: RIGHT vertebral dominant. LEFT vertebral severely diseased throughout its course. No evidence of dissection, stenosis (50% or greater) or occlusion.  Nonvascular structures: Cervical spondylosis. Ossification posterior longitudinal ligament. Patient is intubated. Mild fluid in the LEFT major fissure. No pneumothorax. No lung nodule. No neck  masses.  CTA HEAD  Anterior circulation: Mild non stenotic cavernous and supraclinoid plaque. No significant stenosis, proximal occlusion, aneurysm, or vascular malformation.  Posterior circulation: RIGHT vertebral is the dominant/sole contributor to the basilar; LEFT vertebral supplies mainly the inferior cerebellum. No significant stenosis, proximal occlusion, aneurysm, or vascular malformation.  Venous sinuses: As permitted by contrast timing, patent.  Anatomic variants: BILATERAL fetal PCA origins contribute to slight basilar hypoplasia  Delayed phase:   No abnormal  intracranial enhancement.  IMPRESSION: No significant extracranial or intracranial flow reducing lesion is evident. The observed pattern of cerebral ischemia on MR is likely due to cardiac emboli, or potentially watershed phenomenon.   Electronically Signed   By: Elsie Stain M.D.   On: 11/18/2014 19:00   Mr Brain Wo Contrast  11/17/2014   CLINICAL DATA:  81 year old male with acute onset altered mental status, suspected new onset seizure of unclear etiology. Unresponsive. Initial encounter.  EXAM: MRI HEAD WITHOUT CONTRAST  TECHNIQUE: Multiplanar, multiecho pulse sequences of the brain and surrounding structures were obtained without intravenous contrast.  COMPARISON:  Head CTs without contrast 10/1914 and 11/14/2014  FINDINGS: There are numerous small foci of restricted diffusion scattered in both cerebral hemispheres, primarily the MCA territories along the superior aspect of both hemispheres. There is some bilateral deep gray matter nuclei involvement, primarily both thalami. There is PCA territory involvement bilaterally. The brainstem is relatively spared. There is occasional right cerebellar hemisphere involvement.  No associated acute hemorrhage or mass effect.  There is a 3-4 cm area of chronic hemorrhage in encephalomalacia in the medial right inferior frontal lobe. There is Patchy and confluent bilateral cerebral white matter T2 and FLAIR hyperintensity. Smaller focus of chronic hemorrhage in the posterior right operculum. Occasional chronic micro hemorrhages elsewhere. T2 heterogeneity in the deep gray matter nuclei mostly resembles perivascular spaces. Occasional chronic lacunar infarcts in the cerebellum. Mild for age patchy T2 and FLAIR hyperintensity in the brainstem.  Major intracranial vascular flow voids are preserved, dominant appearing distal right vertebral artery  No definite signal abnormality in the hippocampal formations or mesial temporal lobes. No intracranial mass effect. No  ventriculomegaly. Negative pituitary. Cervicomedullary junction remarkable for degenerative ligamentous hypertrophy about the odontoid. Normal bone marrow signal.  Visible internal auditory structures appear normal. Mastoids are clear. Paranasal sinuses are clear. Negative orbit and scalp soft tissues.  IMPRESSION: 1. Numerous punctate foci of restricted diffusion throughout the brain, with superior MCA territory involvement predominating. Appearance is most suggestive of widespread lacunar infarcts as the sequelae of cardiac or proximal aortic emboli. 2. No associated mass effect or acute hemorrhage. 3. Areas of chronic hemorrhage and encephalomalacia in the right hemisphere and moderate for age signal changes suggesting chronic small vessel disease.   Electronically Signed   By: Odessa Fleming M.D.   On: 11/17/2014 13:06   US Renal  11/17/2014   CLINICAL DATA:  Acute kidney injury.  EXAM: RENAL / URINARY TRACT ULTRASOUND COMPLETE  COMPARISON:  None.  FINDINGS: Right Kidney:  Length: 8.3 cm. Right kidney is small. Negative for hydronephrosis. Echogenicity of the right kidney is within normal limits.  Left Kidney:  Length: 9.1 cm. Negative for left hydronephrosis. Echogenicity in the left kidney is within normal limits. Left kidney lower pole is not well visualized.  Bladder:  Urinary bladder is decompressed with a Foley catheter.  IMPRESSION: Negative for hydronephrosis.   Electronically Signed   By: Richarda Overlie M.D.   On: 11/17/2014 15:33   Pelvis Portable  2014-11-22  CLINICAL DATA:  81 year old male status post right hip surgery for proximal femur fracture. Initial encounter.  EXAM: PORTABLE PELVIS 1-2 VIEWS  COMPARISON:  11/14/2014.  FINDINGS: Portable supine AP views of the pelvis 2311 hrs. Right proximal femoral arthroplasty now in place with cerclage wires. Hardware appears intact. Alignment with the acetabulum appears normal on this view. Surrounding postoperative changes to the soft tissues with  subcutaneous gas. No unexpected osseous changes or new osseous abnormality identified.  IMPRESSION: Right hip arthroplasty with no adverse features.   Electronically Signed   By: Odessa Fleming M.D.   On: 11/27/2014 23:35   Dg Chest Port 1 View  11/20/2014   CLINICAL DATA:  Check endotracheal tube placement  EXAM: PORTABLE CHEST - 1 VIEW  COMPARISON:  11/19/2014  FINDINGS: Cardiac shadow is within normal limits. A left jugular central line, endotracheal tube and nasogastric catheter are again identified in stable position. The lungs are well aerated bilaterally. No bony abnormality is noted.  IMPRESSION: Tubes and lines as described.  No acute abnormality noted.   Electronically Signed   By: Alcide Clever M.D.   On: 11/20/2014 07:53   Dg Chest Port 1 View  11/19/2014   CLINICAL DATA:  Intubation, RIGHT hip fracture, respiratory failure, bradycardia, history hypertension, hyperlipidemia  EXAM: PORTABLE CHEST - 1 VIEW  COMPARISON:  Portable exam 0617 hours compared 07/06/2019 2016  FINDINGS: Tip of endotracheal tube projects 1.8 cm above carina.  LEFT jugular central venous catheter tip projects over SVC.  Normal heart size, mediastinal contours, and pulmonary vascularity.  Lungs clear.  No pleural effusion or pneumothorax.  Bones demineralized.  IMPRESSION: Satisfactory endotracheal tube position.  No acute abnormalities.   Electronically Signed   By: Ulyses Southward M.D.   On: 11/19/2014 08:07   Portable Chest Xray  11/18/2014   CLINICAL DATA:  Hypoxia  EXAM: PORTABLE CHEST - 1 VIEW  COMPARISON:  November 14, 2014  FINDINGS: Endotracheal tube tip is 2.9 cm above the carina. Central catheter tip is in the superior vena cava. There is no pneumothorax. There is no edema or consolidation. The heart size and pulmonary vascularity are normal. No adenopathy. No bone lesions.  IMPRESSION: Tube and catheter positions as described without pneumothorax. No edema or consolidation.   Electronically Signed   By: Bretta Bang III  M.D.   On: 11/18/2014 12:50   Dg Abd Portable 1v  11/19/2014   CLINICAL DATA:  Stroke.  OG tube placement  EXAM: PORTABLE ABDOMEN - 1 VIEW  COMPARISON:  None.  FINDINGS: Enteric tube tip is in the mid stomach. Mild gaseous distention of visualize colon. Visualized lung fields are clear.  IMPRESSION: Enteric tube tip in the mid stomach.   Electronically Signed   By: Charlett Nose M.D.   On: 11/19/2014 12:31   Dg Hip Unilat With Pelvis 1v Right  11/07/2014   CLINICAL DATA:  Right hip pain after a fall today.  EXAM: RIGHT HIP (WITH PELVIS) 1 VIEW  COMPARISON:  None.  FINDINGS: Transverse fracture through the right femoral neck with varus angulation of the fracture fragments. No dislocation of the hip joint. Degenerative changes in the lower lumbar spine and both hips. Pelvis appears intact. SI joints and symphysis pubis are not displaced.  IMPRESSION: Acute transverse fracture of the right hip with varus angulation.   Electronically Signed   By: Burman Nieves M.D.   On: 11/03/2014 00:04   Dg Hip Operative Unilat With Pelvis Right  11/20/2014  CLINICAL DATA:  Right hip replacement.  EXAM: OPERATIVE RIGHT HIP (WITH PELVIS IF PERFORMED) 3 VIEWS  TECHNIQUE: Fluoroscopic spot image(s) were submitted for interpretation post-operatively.  FLUOROSCOPY TIME:  Radiation Exposure Index (as provided by the fluoroscopic device): Not applicable  If the device does not provide the exposure index:  Fluoroscopy Time:  0 min 41 seconds  Number of Acquired Images:  3  COMPARISON:  Yesterday.  FINDINGS: Interval right hip prosthesis in satisfactory position and alignment on frontal views. No fracture or dislocation seen on these views.  IMPRESSION: Satisfactory postoperative appearance of a right hip prosthesis in the frontal projection.   Electronically Signed   By: Beckie Salts M.D.   On: 11/16/2014 21:41   Dg Femur, Min 2 Views Right  11/07/2014   CLINICAL DATA:  81 year old male who fell today with right hip pain.  Initial encounter.  EXAM: RIGHT FEMUR 2 VIEWS  COMPARISON:  [facility] right hip series 1815 hr 11/14/2014.  FINDINGS: Comminuted right femoral neck fracture with varus impaction re- demonstrated. Configuration is stable since 1815 hrs.  The more distal right femur appears intact. Severe tricompartmental degenerative changes at the right knee. Calcified atherosclerosis in the right lower extremity.  IMPRESSION: Mildly comminuted right femoral neck fracture with varus impaction is stable from earlier today.  No other right femur fracture identified.   Electronically Signed   By: Odessa Fleming M.D.   On: 11/11/2014 00:07     CBC  Recent Labs Lab 11/17/14 0510 11/18/14 0602 11/19/14 0600 11/20/14 0440  WBC 9.2 7.9 5.9 4.8  HGB 9.9* 9.4* 8.6* 8.6*  HCT 29.0* 28.1* 25.0* 25.6*  PLT 101* 80* 75* 72*  MCV 87.9 88.4 86.8 87.7  MCH 30.9 29.6 29.9 29.5  MCHC 35.2 33.5 34.4 33.6  RDW 14.9 14.9 15.0 15.2    Chemistries   Recent Labs Lab 11/17/14 0510 11/18/14 0602 11/19/14 0600 11/20/14 0440  NA 135 141 144 146*  K 4.6 4.1 3.5 3.9  CL 107 112* 115* 119*  CO2 23 24 24 24   GLUCOSE 104* 80 94 144*  BUN 41* 36* 34* 30*  CREATININE 1.93* 1.41* 1.10 1.08  CALCIUM 7.6* 7.8* 7.9* 8.0*  MG  --   --  2.0 2.0    CBG:  Recent Labs Lab 11/20/14 1606 11/20/14 1945 11/20/14 2312 11/21/14 0349 11/21/14 0810  GLUCAP 164* 136* 119* 137* 104*       Studies: No results found.    Lab Results  Component Value Date   HGBA1C 5.8* 11/17/2014   Lab Results  Component Value Date   LDLCALC 80 11/17/2014   CREATININE 1.08 11/20/2014       Scheduled Meds: . antiseptic oral rinse  7 mL Mouth Rinse QID  . chlorhexidine  15 mL Mouth Rinse BID  . levETIRAcetam  500 mg Intravenous Q12H  . scopolamine  1 patch Transdermal Q72H   Continuous Infusions: . morphine 10 mg/hr (11/22/14 1317)    Principal Problem:   Closed right hip fracture Active Problems:   Hypertension    Fall   Anemia   Bradycardia, sinus   Femoral neck fracture   Displaced fracture of right femoral neck   Seizure   Cerebral embolism with cerebral infarction   Acute encephalopathy   Acute respiratory failure with hypoxemia   AKI (acute kidney injury)    Time spent: 45 minutes   [facility]  [facility] Pager [phone]. If 7PM-7AM, please contact night-coverage at [email or web address], password [facility] 12-04-2014,  11:54 AM  LOS: 9 days

## 2014-11-28 NOTE — Progress Notes (Signed)
Notified medical examiner of patient's death and patient will be a medical examiner's case. Bed control is aware and they will notify the funeral home when body can be picked up.

## 2014-11-28 NOTE — Progress Notes (Signed)
Wasted 30cc of morphine from Morphine drip infusing into sink. Second RN Kirtland Bouchard witnessed waste. Patient expired at 14:30.

## 2014-11-28 NOTE — Progress Notes (Signed)
Notified physician and family of patient's death. Family will not be coming to hospital per the patient's wife and daughter.

## 2014-11-28 DEATH — deceased

## 2015-09-16 IMAGING — US US RENAL
1 series · 14 of 24 positions shown · non-contrast
Comparison: None.

CLINICAL DATA: Acute kidney injury.

EXAM:
RENAL / URINARY TRACT ULTRASOUND COMPLETE

[Series 1: us renal · 0.27mm/px · 14 of 24 slices shown]
[im 1/24]
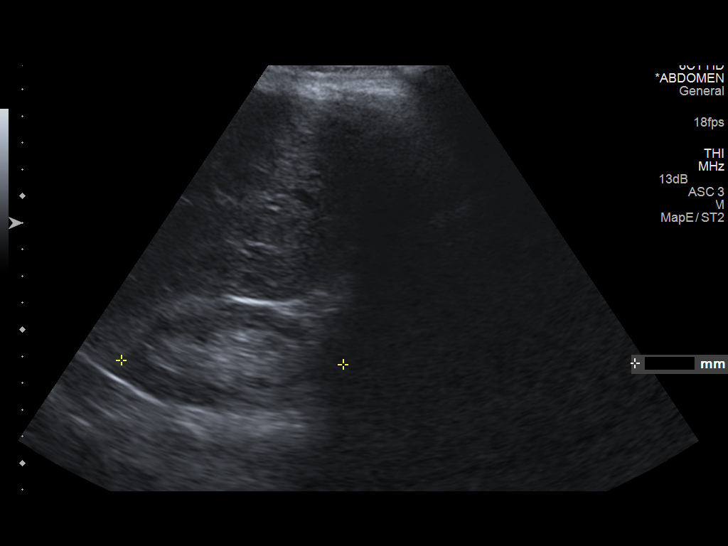
[im 3/24]
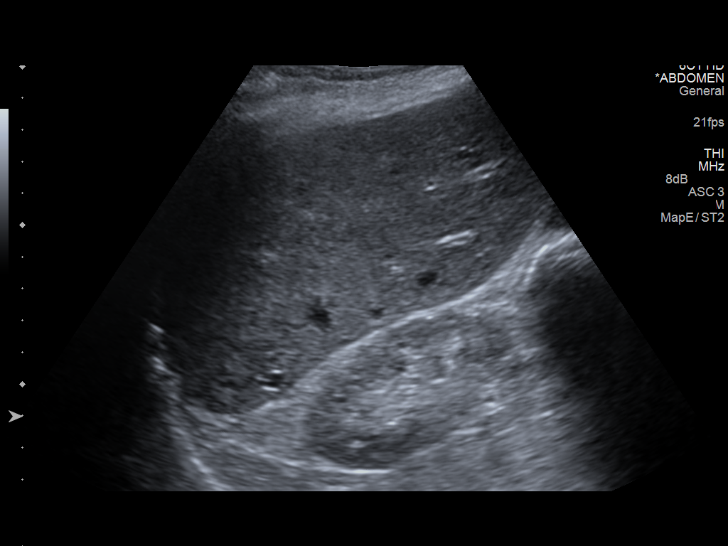
[im 5/24]
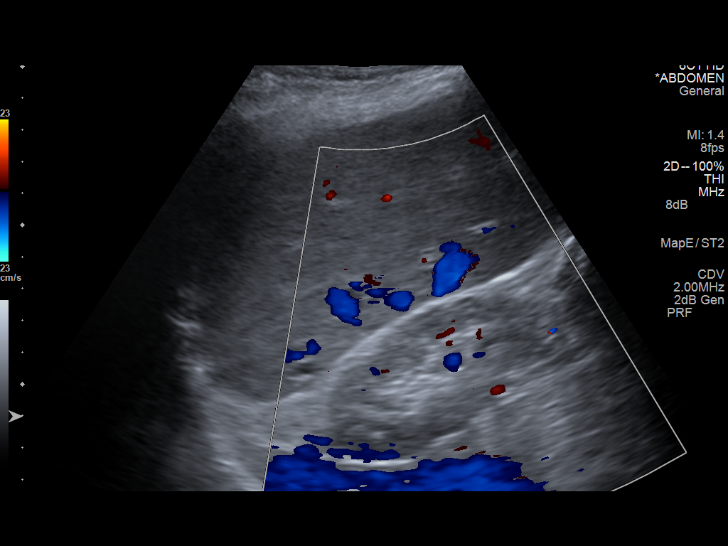
[im 7/24]
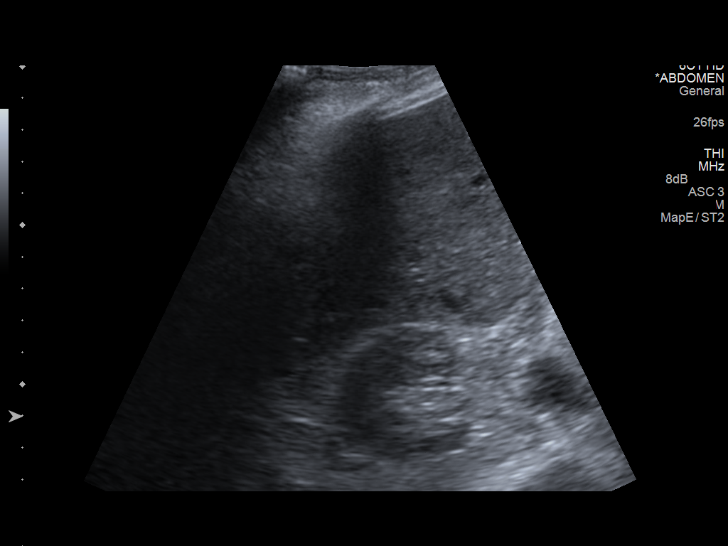
[im 8/24]
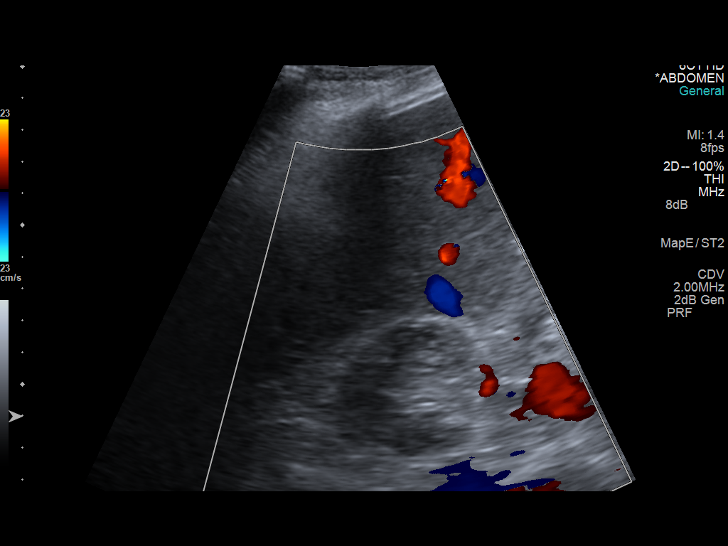
[im 10/24]
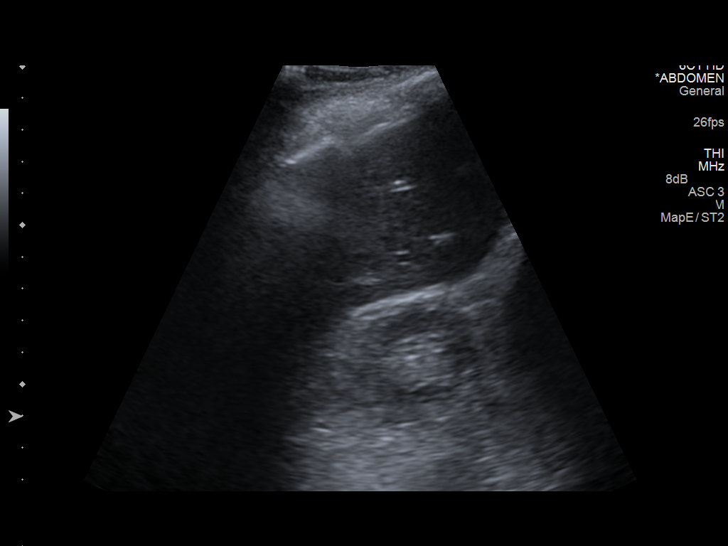
[im 12/24]
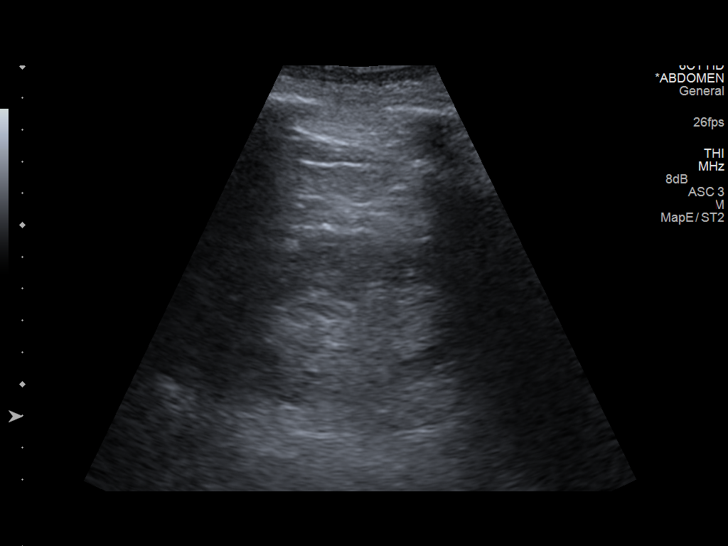
[im 13/24]
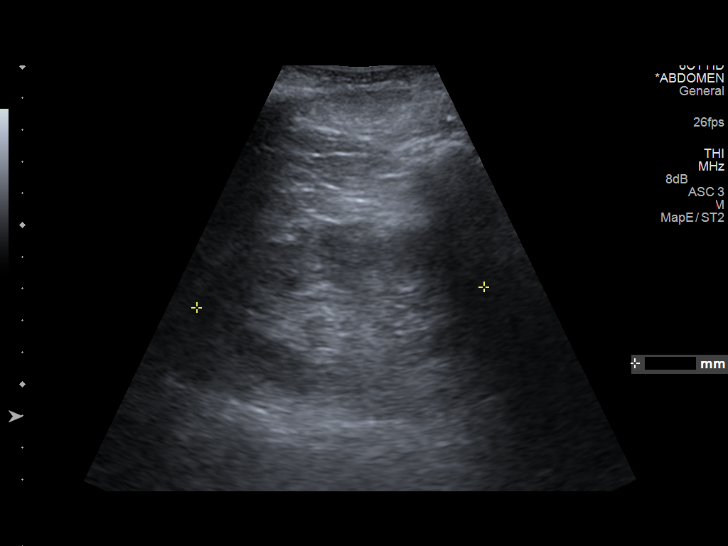
[im 15/24]
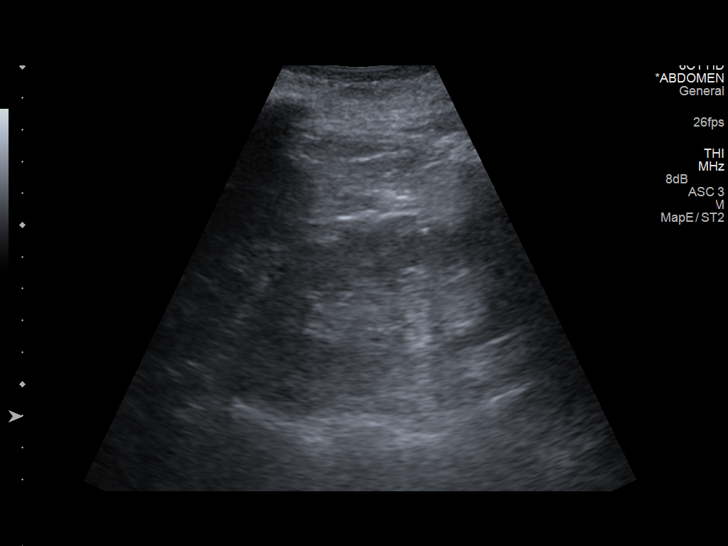
[im 17/24]
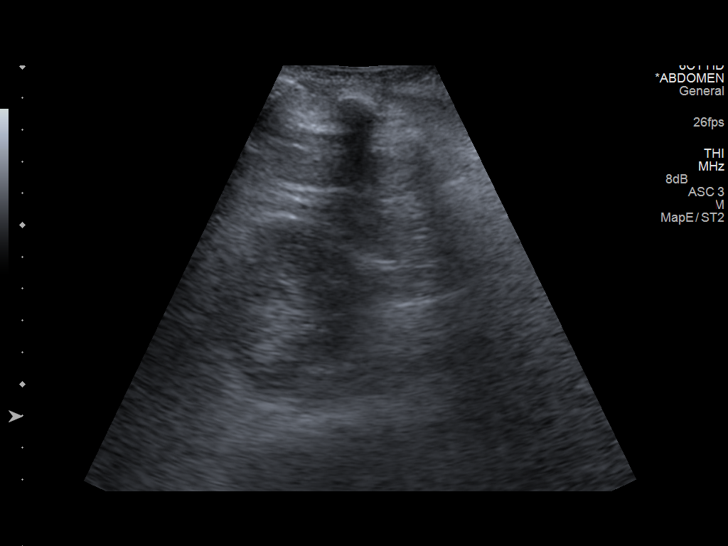
[im 19/24]
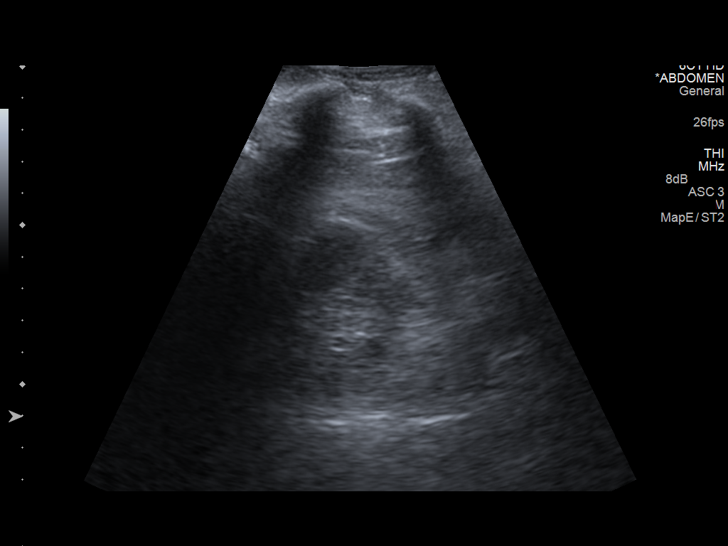
[im 20/24]
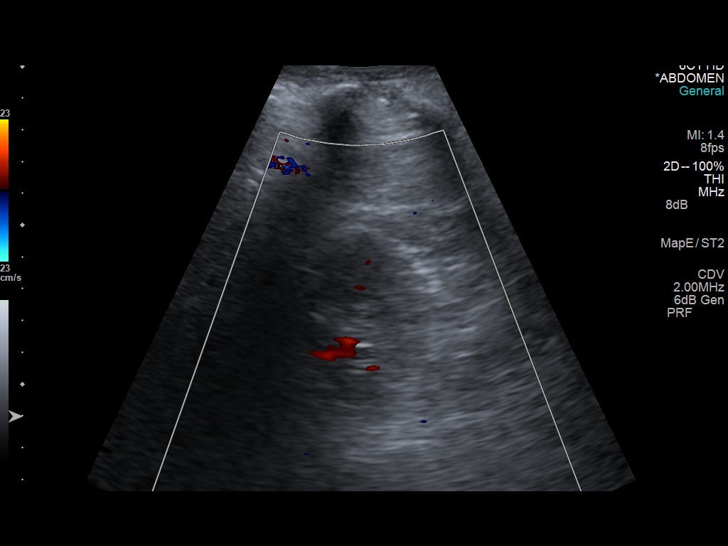
[im 22/24]
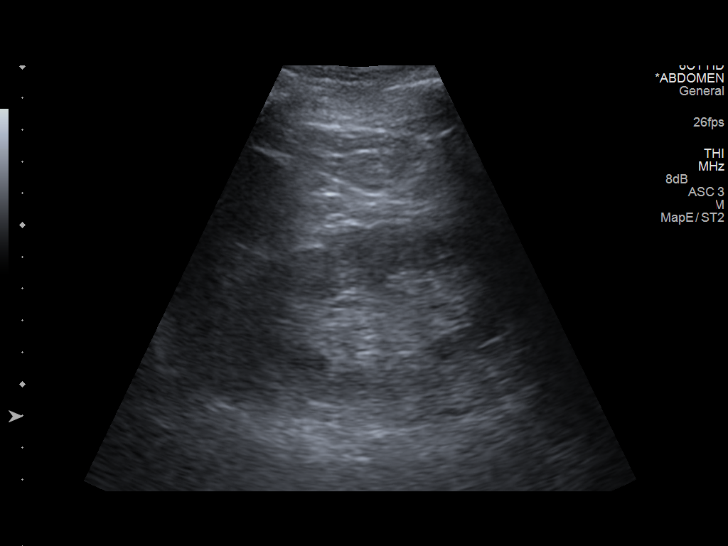
[im 24/24]
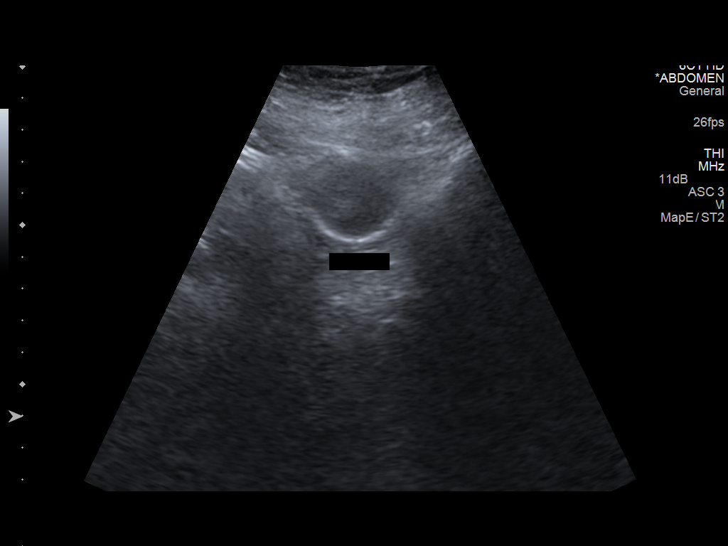

[14 of 24 positions shown; findings below may reference images not displayed]

FINDINGS: Right Kidney:

Length: 8.3 cm. Right kidney is small. Negative for hydronephrosis.
Echogenicity of the right kidney is within normal limits.

Left Kidney:

Length: 9.1 cm. Negative for left hydronephrosis. Echogenicity in
the left kidney is within normal limits. Left kidney lower pole is
not well visualized.

Bladder:

Urinary bladder is decompressed with a Foley catheter.
IMPRESSION: Negative for hydronephrosis.

## 2015-12-16 IMAGING — DX DG CHEST 1V PORT
1 series · 1 of 1 positions shown · non-contrast
Comparison: November 14, 2014

CLINICAL DATA: Hypoxia

EXAM:
PORTABLE CHEST - 1 VIEW

[ap]
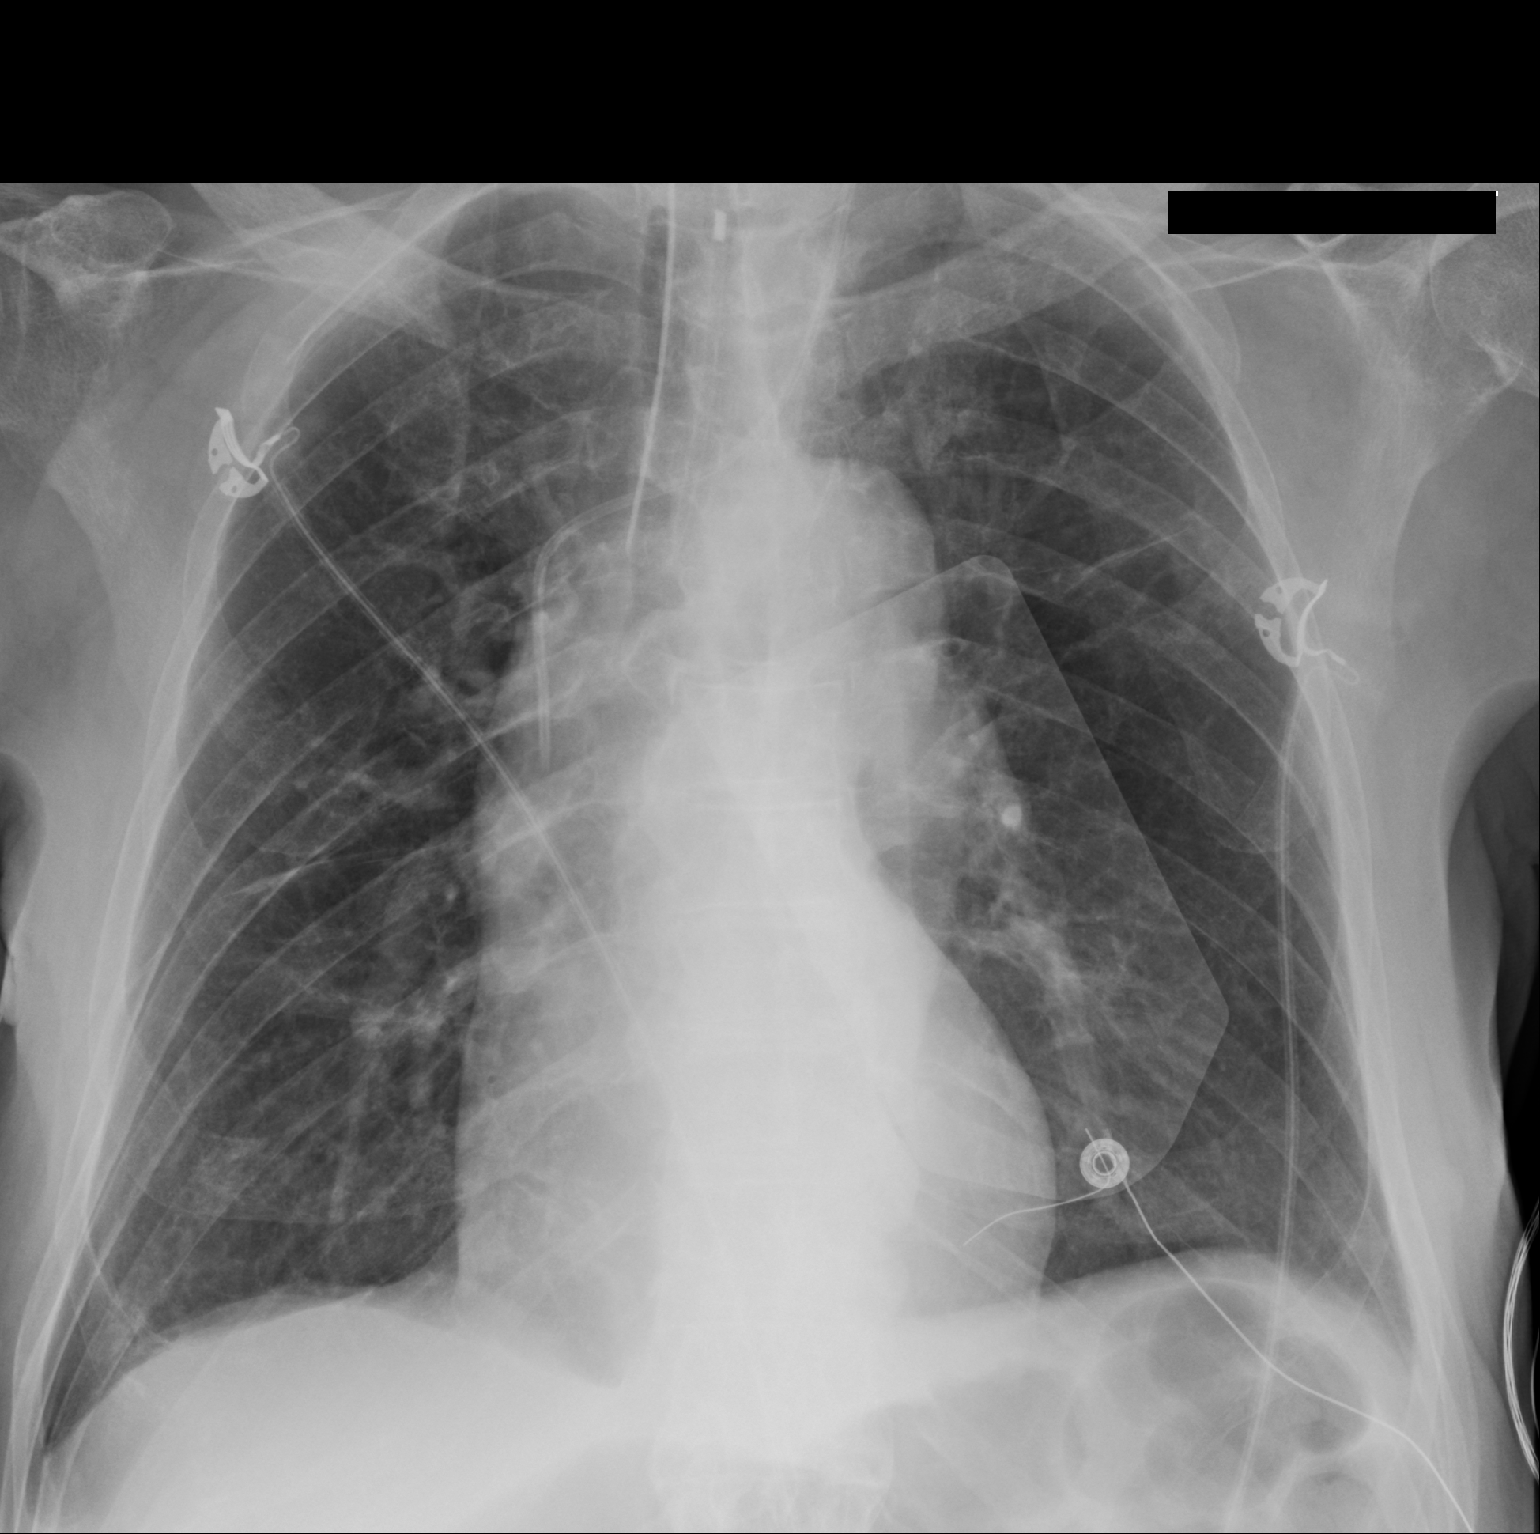

[1 of 1 positions shown; findings below may reference images not displayed]

FINDINGS: Endotracheal tube tip is 2.9 cm above the carina. Central catheter
tip is in the superior vena cava. There is no pneumothorax. There is
no edema or consolidation. The heart size and pulmonary vascularity
are normal. No adenopathy. No bone lesions.
IMPRESSION: Tube and catheter positions as described without pneumothorax. No
edema or consolidation.

## 2015-12-18 IMAGING — CR DG CHEST 1V PORT
1 series · 1 of 1 positions shown · non-contrast
Comparison: 11/19/2014

CLINICAL DATA: Check endotracheal tube placement

EXAM:
PORTABLE CHEST - 1 VIEW

[AP]
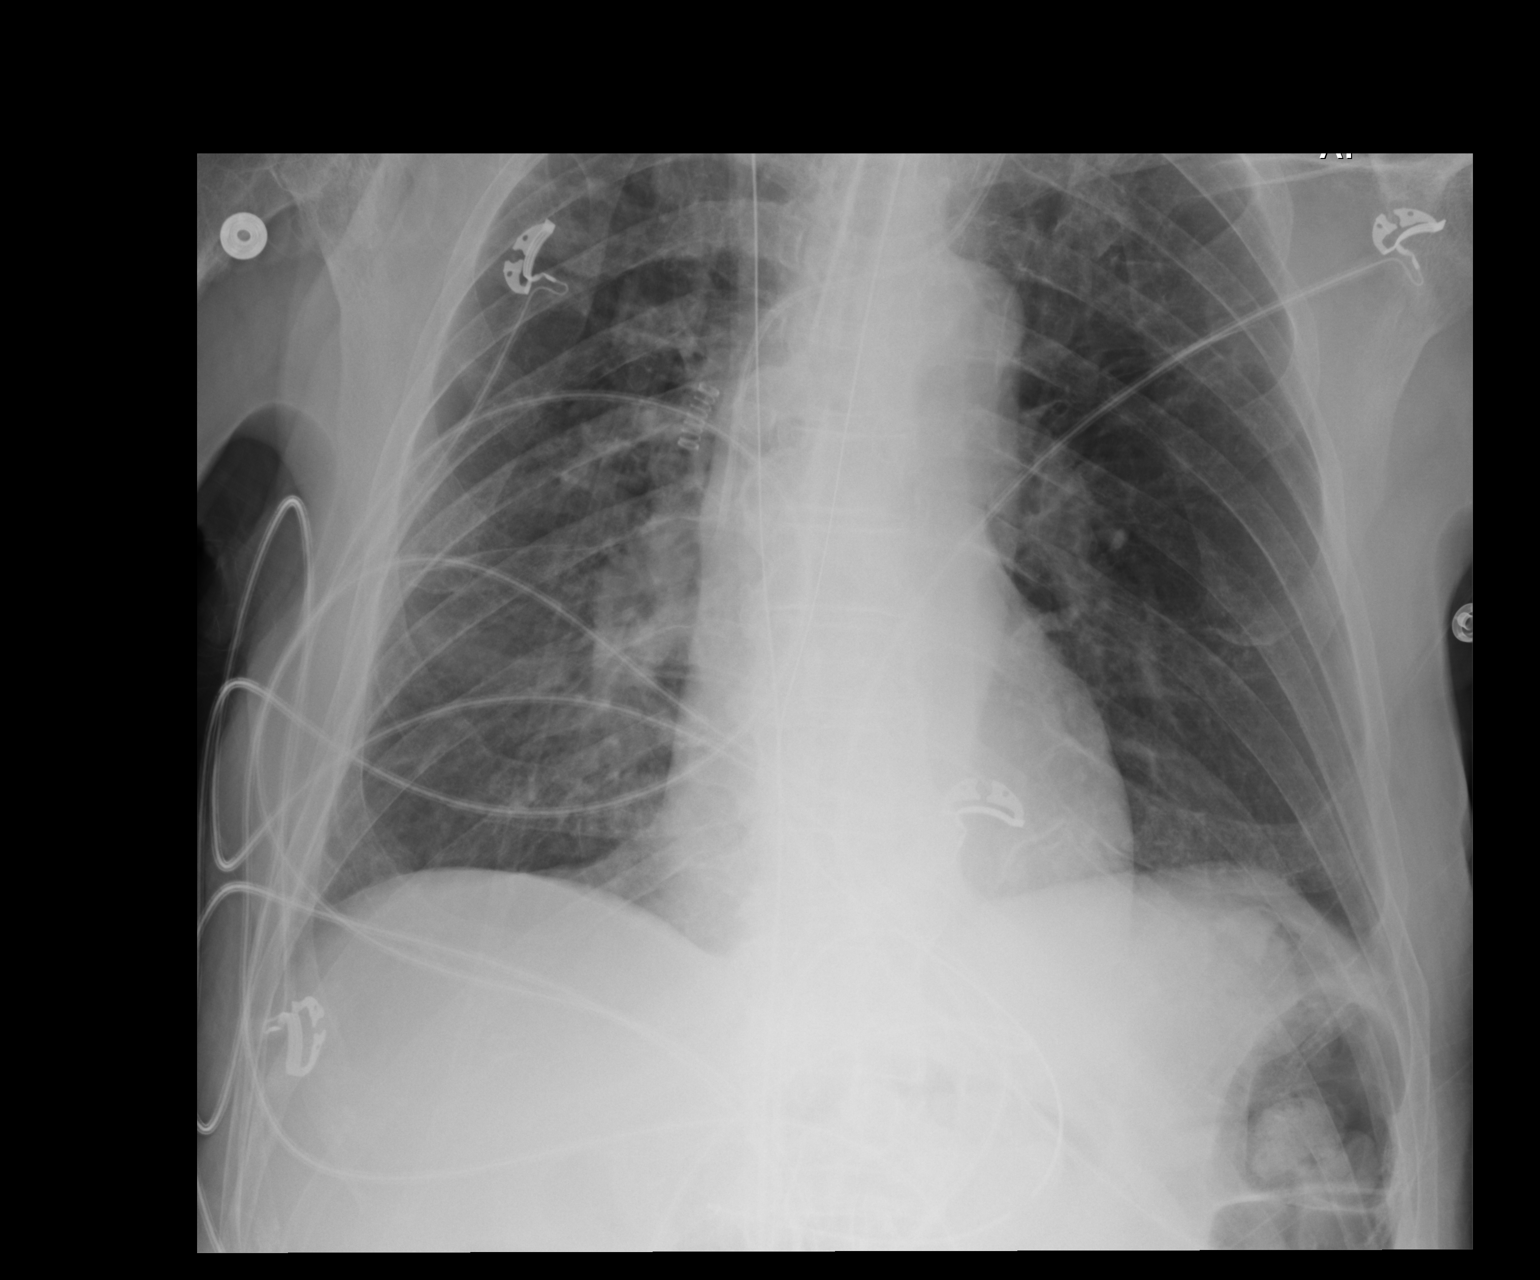

[1 of 1 positions shown; findings below may reference images not displayed]

FINDINGS: Cardiac shadow is within normal limits. A left jugular central line,
endotracheal tube and nasogastric catheter are again identified in
stable position. The lungs are well aerated bilaterally. No bony
abnormality is noted.
IMPRESSION: Tubes and lines as described.

No acute abnormality noted.
